# Patient Record
Sex: Female | Born: 1970 | Race: White | Hispanic: No | Marital: Married | Smoking: Former smoker
Health system: Southern US, Community
[De-identification: ages and names within clinical notes are randomized; demographics above are authoritative.]

## PROBLEM LIST (undated history)

## (undated) DIAGNOSIS — E119 Type 2 diabetes mellitus without complications: Secondary | ICD-10-CM

---

## 2001-10-02 ENCOUNTER — Other Ambulatory Visit: Admission: RE | Admit: 2001-10-02 | Discharge: 2001-10-02 | Payer: Self-pay | Admitting: Family Medicine

## 2006-02-23 ENCOUNTER — Emergency Department (HOSPITAL_COMMUNITY): Admission: EM | Admit: 2006-02-23 | Discharge: 2006-02-23 | Payer: Self-pay | Admitting: Emergency Medicine

## 2013-03-24 ENCOUNTER — Other Ambulatory Visit (HOSPITAL_COMMUNITY)
Admission: RE | Admit: 2013-03-24 | Discharge: 2013-03-24 | Disposition: A | Discharge status: Home / Self Care | Payer: BC Managed Care – PPO | Source: Ambulatory Visit | Attending: Family Medicine | Admitting: Family Medicine

## 2013-03-24 ENCOUNTER — Other Ambulatory Visit: Payer: Self-pay | Admitting: Family Medicine

## 2013-03-24 DIAGNOSIS — Z1151 Encounter for screening for human papillomavirus (HPV): Secondary | ICD-10-CM | POA: Insufficient documentation

## 2013-03-24 DIAGNOSIS — Z124 Encounter for screening for malignant neoplasm of cervix: Secondary | ICD-10-CM | POA: Insufficient documentation

## 2013-03-25 ENCOUNTER — Other Ambulatory Visit: Payer: Self-pay | Admitting: Family Medicine

## 2013-03-25 DIAGNOSIS — Z1231 Encounter for screening mammogram for malignant neoplasm of breast: Secondary | ICD-10-CM

## 2013-04-30 ENCOUNTER — Ambulatory Visit: Payer: Self-pay

## 2013-05-06 ENCOUNTER — Ambulatory Visit
Admission: RE | Admit: 2013-05-06 | Discharge: 2013-05-06 | Disposition: A | Discharge status: Home / Self Care | Payer: BC Managed Care – PPO | Source: Ambulatory Visit | Attending: Family Medicine | Admitting: Family Medicine

## 2013-05-06 DIAGNOSIS — Z1231 Encounter for screening mammogram for malignant neoplasm of breast: Secondary | ICD-10-CM

## 2014-02-22 ENCOUNTER — Encounter: Payer: Self-pay | Admitting: Endocrinology

## 2014-02-22 ENCOUNTER — Ambulatory Visit (INDEPENDENT_AMBULATORY_CARE_PROVIDER_SITE_OTHER): Payer: BC Managed Care – PPO | Admitting: Endocrinology

## 2014-02-22 VITALS — BP 122/82 | HR 109 | Temp 99.5°F | Ht 63.0 in | Wt 240.0 lb

## 2014-02-22 DIAGNOSIS — I10 Essential (primary) hypertension: Secondary | ICD-10-CM

## 2014-02-22 DIAGNOSIS — E785 Hyperlipidemia, unspecified: Secondary | ICD-10-CM

## 2014-02-22 DIAGNOSIS — E119 Type 2 diabetes mellitus without complications: Secondary | ICD-10-CM

## 2014-02-22 MED ORDER — INSULIN LISPRO 100 UNIT/ML (KWIKPEN): 10.0000 [IU] | PEN_INJECTOR | Freq: Three times a day (TID) | SUBCUTANEOUS | Status: DC

## 2014-02-22 NOTE — Progress Notes (Signed)
   Subjective:    Patient ID: Crystal Dudley, female    DOB: 11/14/1971, 43 y.o.   MRN: [id number]  HPI pt states DM was dx'ed in 2007; she has mild if any neuropathy of the lower extremities; she is unaware of any associated chronic complications.  She took lantus from 2010-2012, but has been off insulin since then.  pt says her diet and exercise are "fair."    No past medical history on file.  No past surgical history on file.  History   Social History  . Marital Status: Single    Spouse Name: N/A    Number of Children: N/A  . Years of Education: N/A   Occupational History  . Not on file.   Social History Main Topics  . Smoking status: Former Games developermoker  . Smokeless tobacco: Not on file  . Alcohol Use: Yes  . Drug Use: Not on file  . Sexual Activity: Not on file   Other Topics Concern  . Not on file   Social History Narrative  . No narrative on file    No current outpatient prescriptions on file prior to visit.   No current facility-administered medications on file prior to visit.    Allergies not on file  No family history on file. DM: father BP 122/82  Pulse 109  Temp(Src) 99.5 F (37.5 C) (Oral)  Ht 5\' 3"  (1.6 m)  Wt 240 lb (108.863 kg)  BMI 42.52 kg/m2  SpO2 95%  Review of Systems denies weight loss, blurry vision, headache, chest pain, sob, n/v, cramps, excessive diaphoresis, difficulty with concentration, depression, rhinorrhea, and easy bruising.  She has regular menses.  She attributes polyuria to diuretic rx.  She has lost weight, due to her efforts.     Objective:   Physical Exam VS: see vs page GEN: no distress HEAD: head: no deformity eyes: no periorbital swelling, no proptosis external nose and ears are normal mouth: no lesion seen NECK: supple, thyroid is not enlarged CHEST WALL: no deformity LUNGS:  Clear to auscultation CV: reg rate and rhythm, no murmur ABD: abdomen is soft, nontender.  no hepatosplenomegaly.  not distended.  no  hernia MUSCULOSKELETAL: muscle bulk and strength are grossly normal.  no obvious joint swelling.  gait is normal and steady PULSES:  no carotid bruit NEURO:  cn 2-12 grossly intact.   readily moves all 4's.   SKIN:  Normal texture and temperature.  No rash or suspicious lesion is visible.   NODES:  None palpable at the neck PSYCH: alert, well-oriented.  Does not appear anxious nor depressed.   outside test results are reviewed: A1c=10.5    Assessment & Plan:  Type 2 DM: poor control.  Oral meds have failed.  This a1c causes very high risk to her health.  We discussed types of insulin.   She chooses qac insulin for now. Weight loss: hyperglycemia may be contributing. Morbid obesity: surgery will help this. Polyuria: probably due to diuretic and and hyperglycemia

## 2014-02-22 NOTE — Patient Instructions (Addendum)
good diet and exercise habits significanly improve the control of your diabetes.  please let me know if you wish to be referred to a dietician.  high blood sugar is very risky to your health.  you should see an eye doctor every year.  You are at higher than average risk for pneumonia and hepatitis-B.  You should be vaccinated against both.   controlling your blood pressure and cholesterol drastically reduces the damage diabetes does to your body.  this also applies to quitting smoking.  please discuss these with your doctor.  check your blood sugar twice a day.  vary the time of day when you check, between before the 3 meals, and at bedtime.  also check if you have symptoms of your blood sugar being too high or too low.  please keep a record of the readings and bring it to your next appointment here.  You can write it on any piece of paper.  please call us sooner if your blood sugar goes below 70, or if you have a lot of readings over 200. Please consider having weight-loss surgery.  You can call [phone], to find out about the next informational meeting.   Please start taking humalog, 10 units 3 times a day (just before each meal).  i have sent a prescription to your pharmacy. Please come back for a follow-up appointment in 2 weeks.

## 2014-02-24 DIAGNOSIS — I10 Essential (primary) hypertension: Secondary | ICD-10-CM | POA: Insufficient documentation

## 2014-02-24 DIAGNOSIS — E785 Hyperlipidemia, unspecified: Secondary | ICD-10-CM | POA: Insufficient documentation

## 2014-02-24 DIAGNOSIS — E119 Type 2 diabetes mellitus without complications: Secondary | ICD-10-CM | POA: Insufficient documentation

## 2014-03-09 ENCOUNTER — Ambulatory Visit (INDEPENDENT_AMBULATORY_CARE_PROVIDER_SITE_OTHER): Payer: BC Managed Care – PPO | Admitting: Endocrinology

## 2014-03-09 ENCOUNTER — Encounter: Payer: Self-pay | Admitting: Endocrinology

## 2014-03-09 VITALS — BP 122/80 | HR 89 | Temp 97.9°F | Ht 63.0 in | Wt 242.0 lb

## 2014-03-09 DIAGNOSIS — E119 Type 2 diabetes mellitus without complications: Secondary | ICD-10-CM

## 2014-03-09 MED ORDER — INSULIN GLARGINE 100 UNIT/ML SOLOSTAR PEN: 50.0000 [IU] | PEN_INJECTOR | SUBCUTANEOUS | Status: DC

## 2014-03-09 NOTE — Patient Instructions (Addendum)
check your blood sugar twice a day.  vary the time of day when you check, between before the 3 meals, and at bedtime.  also check if you have symptoms of your blood sugar being too high or too low.  please keep a record of the readings and bring it to your next appointment here.  You can write it on any piece of paper.  please call us sooner if your blood sugar goes below 70, or if you have a lot of readings over 200. Please consider having weight-loss surgery.  You can call [phone], to find out about the next informational meeting.   Please change humalog to lantus, 50 units each morning.  i have sent a prescription to your pharmacy. You can stop taking the kombiglyze and invokana. Please come back for a follow-up appointment in 1 month.

## 2014-03-09 NOTE — Progress Notes (Signed)
   Subjective:    Patient ID: Crystal Dudley, female    DOB: 08/09/1971, 43 y.o.   MRN: [id number]  HPI pt returns for f/u of insulin-requiring DM (dx'ed 2007; she has mild if any neuropathy of the lower extremities; she is unaware of any associated chronic complications.  She took lantus from 2010-2012, but has been off insulin until it was restarted in March of 2015.  Pt says she can't remember to take the insulin tid.  She wants to change to QD insulin.  no cbg record, but states cbg's are in the high-100's.  pt states she feels well in general. No past medical history on file.  No past surgical history on file.  History   Social History  . Marital Status: Single    Spouse Name: N/A    Number of Children: N/A  . Years of Education: N/A   Occupational History  . Not on file.   Social History Main Topics  . Smoking status: Former Games developermoker  . Smokeless tobacco: Not on file  . Alcohol Use: Yes  . Drug Use: Not on file  . Sexual Activity: Not on file   Other Topics Concern  . Not on file   Social History Narrative  . No narrative on file    Current Outpatient Prescriptions on File Prior to Visit  Medication Sig Dispense Refill  . aspirin 81 MG tablet Take 81 mg by mouth daily.      Marland Kitchen. BYDUREON 2 MG SUSR       . hydrochlorothiazide (HYDRODIURIL) 25 MG tablet Take 25 mg by mouth daily.      Marland Kitchen. lisinopril (PRINIVIL,ZESTRIL) 40 MG tablet       . simvastatin (ZOCOR) 40 MG tablet        No current facility-administered medications on file prior to visit.    Allergies  Allergen Reactions  . Codeine Nausea And Vomiting    No family history on file.  BP 122/80  Pulse 89  Temp(Src) 97.9 F (36.6 C) (Oral)  Ht 5\' 3"  (1.6 m)  Wt 242 lb (109.77 kg)  BMI 42.88 kg/m2  SpO2 95%  Review of Systems She denies hypoglycemia and weight change.      Objective:   Physical Exam VITAL SIGNS:  See vs page GENERAL: no distress SKIN:  Insulin injection sites at the anterior abdomen  are normal.       Assessment & Plan:  Type 2 DM: she needs increased rx.  We discussed types of insulin.  She wants to change to QD insulin.   Morbid obesity: this complicates the rx of DM.

## 2014-04-11 ENCOUNTER — Ambulatory Visit (INDEPENDENT_AMBULATORY_CARE_PROVIDER_SITE_OTHER): Payer: BC Managed Care – PPO | Admitting: Endocrinology

## 2014-04-11 ENCOUNTER — Encounter: Payer: Self-pay | Admitting: Endocrinology

## 2014-04-11 VITALS — BP 128/86 | HR 91 | Temp 99.3°F | Ht 63.0 in | Wt 249.0 lb

## 2014-04-11 DIAGNOSIS — E119 Type 2 diabetes mellitus without complications: Secondary | ICD-10-CM

## 2014-04-11 NOTE — Patient Instructions (Addendum)
check your blood sugar twice a day.  vary the time of day when you check, between before the 3 meals, and at bedtime.  also check if you have symptoms of your blood sugar being too high or too low.  please keep a record of the readings and bring it to your next appointment here.  You can write it on any piece of paper.  please call us sooner if your blood sugar goes below 70, or if you have a lot of readings over 200.   Our goal is to get the blood sugar to the low-100's.  If it is higher, please try to re-increase the lantus, to see if the nausea happens again.   Please come back for a follow-up appointment in 2 months.

## 2014-04-11 NOTE — Progress Notes (Signed)
   Subjective:    Patient ID: Crystal Dudley, female    DOB: 10/18/1971, 43 y.o.   MRN: [id number]  HPI pt returns for f/u of insulin-requiring DM (dx'ed 2007; she has mild if any neuropathy of the lower extremities; she is unaware of any associated chronic complications.  She took lantus from 2010-2012, but has been off insulin until it was restarted in March of 2015; she takes qd insulin, because she can't remember to take it tid; she declined weight-loss surgery).  She reduced the lantus to 40 units qd, due to nausea.  On the reduced amount, the nausea is resolved.  She does not check cbg's.  She says she has a meter and strips, but does not check.   No past medical history on file.  No past surgical history on file.  History   Social History  . Marital Status: Single    Spouse Name: N/A    Number of Children: N/A  . Years of Education: N/A   Occupational History  . Not on file.   Social History Main Topics  . Smoking status: Former Games developermoker  . Smokeless tobacco: Not on file  . Alcohol Use: Yes  . Drug Use: Not on file  . Sexual Activity: Not on file   Other Topics Concern  . Not on file   Social History Narrative  . No narrative on file    Current Outpatient Prescriptions on File Prior to Visit  Medication Sig Dispense Refill  . aspirin 81 MG tablet Take 81 mg by mouth daily.      Marland Kitchen. BYDUREON 2 MG SUSR       . hydrochlorothiazide (HYDRODIURIL) 25 MG tablet Take 25 mg by mouth daily.      Marland Kitchen. lisinopril (PRINIVIL,ZESTRIL) 40 MG tablet       . simvastatin (ZOCOR) 40 MG tablet        No current facility-administered medications on file prior to visit.    Allergies  Allergen Reactions  . Codeine Nausea And Vomiting    No family history on file.  BP 128/86  Pulse 91  Temp(Src) 99.3 F (37.4 C) (Oral)  Ht 5\' 3"  (1.6 m)  Wt 249 lb (112.946 kg)  BMI 44.12 kg/m2  SpO2 97%  Review of Systems She denies hypoglycemia and weight change.      Objective:   Physical  Exam VITAL SIGNS:  See vs page GENERAL: no distress     Assessment & Plan:  Nausea: more likely due to bydureon, rather than insulin, but pt says she wants to continue the bydureon.  Noncompliance with cbg recording: we discussed the importance of compliance.

## 2014-06-21 ENCOUNTER — Ambulatory Visit: Payer: BC Managed Care – PPO | Admitting: Endocrinology

## 2014-07-05 ENCOUNTER — Ambulatory Visit (INDEPENDENT_AMBULATORY_CARE_PROVIDER_SITE_OTHER): Payer: BC Managed Care – PPO | Admitting: Endocrinology

## 2014-07-05 ENCOUNTER — Encounter: Payer: Self-pay | Admitting: Endocrinology

## 2014-07-05 VITALS — BP 124/76 | HR 94 | Temp 99.1°F | Ht 63.0 in | Wt 241.0 lb

## 2014-07-05 DIAGNOSIS — E119 Type 2 diabetes mellitus without complications: Secondary | ICD-10-CM

## 2014-07-05 LAB — BASIC METABOLIC PANEL
BUN: 13 mg/dL (ref 6–23)
CO2: 24 meq/L (ref 19–32)
Calcium: 9.2 mg/dL (ref 8.4–10.5)
Chloride: 99 mEq/L (ref 96–112)
Creatinine, Ser: 0.6 mg/dL (ref 0.4–1.2)
GFR: 110.4 mL/min (ref >60.00)
Glucose, Bld: 317 mg/dL — ABNORMAL HIGH (ref 70–99)
POTASSIUM: 4.6 meq/L (ref 3.5–5.1)
SODIUM: 133 meq/L — AB (ref 135–145)

## 2014-07-05 LAB — MICROALBUMIN / CREATININE URINE RATIO
CREATININE, U: 31.5 mg/dL
MICROALB UR: 2.7 mg/dL — AB (ref 0.0–1.9)
MICROALB/CREAT RATIO: 8.6 mg/g (ref 0.0–30.0)

## 2014-07-05 LAB — HEMOGLOBIN A1C: HEMOGLOBIN A1C: 12.8 % — AB (ref 4.6–6.5)

## 2014-07-05 NOTE — Progress Notes (Signed)
Subjective:    Patient ID: Crystal Dudley, female    DOB: 07/19/1971, 43 y.o.   MRN: [id number]  HPI pt returns for f/u of insulin-requiring DM (dx'ed 2007; she has mild if any neuropathy of the lower extremities; she is unaware of any associated chronic complications.  He has never had GDM, pancreatitis, severe hypoglycemia, or DKA; she took lantus from 2010-2012, but has been off insulin until it was restarted in March of 2015; she was rx'ed qd insulin, because she can't remember to take it tid; she declined weight-loss surgery).  Pt says she is having trouble remembering to take the insulin.  She says she has not taken it in the past month.  She seldom checks cbg's.  Nausea is resolved.  She has urinary frequency.  She says it would be easier for her to remember diabetes pills.  No past medical history on file.  No past surgical history on file.  History   Social History  . Marital Status: Single    Spouse Name: N/A    Number of Children: N/A  . Years of Education: N/A   Occupational History  . Not on file.   Social History Main Topics  . Smoking status: Former Games developermoker  . Smokeless tobacco: Not on file  . Alcohol Use: Yes  . Drug Use: Not on file  . Sexual Activity: Not on file   Other Topics Concern  . Not on file   Social History Narrative  . No narrative on file    Current Outpatient Prescriptions on File Prior to Visit  Medication Sig Dispense Refill  . aspirin 81 MG tablet Take 81 mg by mouth daily.      . cetirizine (ZYRTEC) 10 MG tablet Take 10 mg by mouth daily.      . hydrochlorothiazide (HYDRODIURIL) 25 MG tablet Take 25 mg by mouth daily.      Marland Kitchen. lisinopril (PRINIVIL,ZESTRIL) 40 MG tablet       . simvastatin (ZOCOR) 40 MG tablet        No current facility-administered medications on file prior to visit.    Allergies  Allergen Reactions  . Codeine Nausea And Vomiting    No family history on file.  BP 124/76  Pulse 94  Temp(Src) 99.1 F (37.3 C)  (Oral)  Ht 5\' 3"  (1.6 m)  Wt 241 lb (109.317 kg)  BMI 42.70 kg/m2  SpO2 99%  Review of Systems Denies depression.  She says she does not have memory loss in general.      Objective:   Physical Exam Pulses: dorsalis pedis intact bilat.   Feet: no deformity. normal color and temp.  no edema.   Skin:  no ulcer on the feet.   Neuro: sensation is intact to touch on the feet.  Lab Results  Component Value Date   HGBA1C 12.8* 07/05/2014      Assessment & Plan:  Noncompliance with cbg recording and insulin dosing, severe exacerbation: I'll work around this as best I can. Nausea, uncertain etiology, resolved.   Patient is advised the following: Patient Instructions  check your blood sugar twice a day.  vary the time of day when you check, between before the 3 meals, and at bedtime.  also check if you have symptoms of your blood sugar being too high or too low.  please keep a record of the readings and bring it to your next appointment here.  You can write it on any piece of paper.  please  call us sooner if your blood sugar goes below 70, or if you have a lot of readings over 200.   blood tests are being requested for you today.  We'll contact you with results. Based on the results, we may be able to get your blood sugar with pills.   Please come back for a follow-up appointment in 1 month.    addendum: pt is advised a1c is too high for oral meds.

## 2014-07-05 NOTE — Patient Instructions (Addendum)
check your blood sugar twice a day.  vary the time of day when you check, between before the 3 meals, and at bedtime.  also check if you have symptoms of your blood sugar being too high or too low.  please keep a record of the readings and bring it to your next appointment here.  You can write it on any piece of paper.  please call us sooner if your blood sugar goes below 70, or if you have a lot of readings over 200.   blood tests are being requested for you today.  We'll contact you with results. Based on the results, we may be able to get your blood sugar with pills.   Please come back for a follow-up appointment in 1 month.

## 2014-08-05 ENCOUNTER — Ambulatory Visit: Payer: BC Managed Care – PPO | Admitting: Endocrinology

## 2014-08-08 ENCOUNTER — Ambulatory Visit: Payer: BC Managed Care – PPO | Admitting: Endocrinology

## 2014-11-10 ENCOUNTER — Ambulatory Visit: Payer: BC Managed Care – PPO | Admitting: Endocrinology

## 2014-11-23 ENCOUNTER — Ambulatory Visit (INDEPENDENT_AMBULATORY_CARE_PROVIDER_SITE_OTHER): Payer: BC Managed Care – PPO | Admitting: Endocrinology

## 2014-11-23 ENCOUNTER — Encounter: Payer: Self-pay | Admitting: Endocrinology

## 2014-11-23 VITALS — BP 132/84 | HR 88 | Temp 99.0°F | Ht 63.0 in | Wt 251.0 lb

## 2014-11-23 DIAGNOSIS — E119 Type 2 diabetes mellitus without complications: Secondary | ICD-10-CM

## 2014-11-23 MED ORDER — INSULIN GLARGINE 100 UNIT/ML SOLOSTAR PEN: 70.0000 [IU] | PEN_INJECTOR | SUBCUTANEOUS | Status: DC

## 2014-11-23 NOTE — Progress Notes (Signed)
Subjective:    Patient ID: Crystal Dudley, female    DOB: 08/18/1971, 43 y.o.   MRN: [id number]  HPI  Pt returns for f/u of diabetes mellitus: DM type: Insulin-requiring type 2 Dx'ed: 2007 Complications: none Therapy: insulin 2010-2012, and for a few mos in early 2015 GDM: never DKA: never Severe hypoglycemia: never Pancreatitis: never Other: she was rx'ed qd insulin, because she can't remember to take it tid; she declined weight-loss surgery Interval history: She has polyuria and polydipsia.  She takes 40-50 units of lantus qd.  She says she seldom misses it altogether.  She does not check cbg's, but she says she has a meter and strips.   No past medical history on file.  No past surgical history on file.  History   Social History  . Marital Status: Single    Spouse Name: N/A    Number of Children: N/A  . Years of Education: N/A   Occupational History  . Not on file.   Social History Main Topics  . Smoking status: Former Games developermoker  . Smokeless tobacco: Not on file  . Alcohol Use: Yes  . Drug Use: Not on file  . Sexual Activity: Not on file   Other Topics Concern  . Not on file   Social History Narrative    Current Outpatient Prescriptions on File Prior to Visit  Medication Sig Dispense Refill  . aspirin 81 MG tablet Take 81 mg by mouth daily.    . Canagliflozin (INVOKANA) 300 MG TABS Take 1 tablet by mouth daily.    . cetirizine (ZYRTEC) 10 MG tablet Take 10 mg by mouth daily.    . hydrochlorothiazide (HYDRODIURIL) 25 MG tablet Take 25 mg by mouth daily.    Marland Kitchen. lisinopril (PRINIVIL,ZESTRIL) 40 MG tablet     . simvastatin (ZOCOR) 40 MG tablet      No current facility-administered medications on file prior to visit.    Allergies  Allergen Reactions  . Codeine Nausea And Vomiting    No family history on file.  BP 132/84 mmHg  Pulse 88  Temp(Src) 99 F (37.2 C) (Oral)  Ht 5\' 3"  (1.6 m)  Wt 251 lb (113.853 kg)  BMI 44.47 kg/m2  SpO2 94%  Review of  Systems She has weight gain, but denies n/v.    Objective:   Physical Exam VITAL SIGNS:  See vs page. GENERAL: no distress. Pulses: dorsalis pedis intact bilat.   Feet: no deformity.  1+ bilat leg edema. Skin:  no ulcer on the feet.  normal color and temp. Neuro: sensation is intact to touch on the feet.    outside test results are reviewed: A1c=12.2  TRIG=[id number]    Assessment & Plan:  DM: severe exacerbation Noncompliance, apparently improved: I'll work around this as best I can.   Weight gain: pt is advised to re-lose   Patient is advised the following: Patient Instructions  Please increase the lantus to 70 units each morning.  i have sent a prescription to your pharmacy check your blood sugar twice a day.  vary the time of day when you check, between before the 3 meals, and at bedtime.  also check if you have symptoms of your blood sugar being too high or too low.  please keep a record of the readings and bring it to your next appointment here.  You can write it on any piece of paper.  please call us sooner if your blood sugar goes below 70, or if you  have a lot of readings over 200.   Please come back for a follow-up appointment in 6 weeks.

## 2014-11-23 NOTE — Patient Instructions (Signed)
Please increase the lantus to 70 units each morning.  i have sent a prescription to your pharmacy check your blood sugar twice a day.  vary the time of day when you check, between before the 3 meals, and at bedtime.  also check if you have symptoms of your blood sugar being too high or too low.  please keep a record of the readings and bring it to your next appointment here.  You can write it on any piece of paper.  please call us sooner if your blood sugar goes below 70, or if you have a lot of readings over 200.   Please come back for a follow-up appointment in 6 weeks.

## 2014-12-03 ENCOUNTER — Emergency Department (HOSPITAL_COMMUNITY): Payer: BC Managed Care – PPO

## 2014-12-03 ENCOUNTER — Encounter (HOSPITAL_COMMUNITY): Payer: Self-pay | Admitting: Emergency Medicine

## 2014-12-03 ENCOUNTER — Emergency Department (HOSPITAL_COMMUNITY)
Admission: EM | Admit: 2014-12-03 | Discharge: 2014-12-04 | Disposition: A | Discharge status: Home / Self Care | Payer: BC Managed Care – PPO | Attending: Emergency Medicine | Admitting: Emergency Medicine

## 2014-12-03 DIAGNOSIS — Z7982 Long term (current) use of aspirin: Secondary | ICD-10-CM | POA: Diagnosis not present

## 2014-12-03 DIAGNOSIS — R1031 Right lower quadrant pain: Secondary | ICD-10-CM | POA: Diagnosis not present

## 2014-12-03 DIAGNOSIS — Z794 Long term (current) use of insulin: Secondary | ICD-10-CM | POA: Insufficient documentation

## 2014-12-03 DIAGNOSIS — Z3202 Encounter for pregnancy test, result negative: Secondary | ICD-10-CM | POA: Insufficient documentation

## 2014-12-03 DIAGNOSIS — Z87891 Personal history of nicotine dependence: Secondary | ICD-10-CM | POA: Diagnosis not present

## 2014-12-03 DIAGNOSIS — N12 Tubulo-interstitial nephritis, not specified as acute or chronic: Secondary | ICD-10-CM | POA: Insufficient documentation

## 2014-12-03 DIAGNOSIS — E119 Type 2 diabetes mellitus without complications: Secondary | ICD-10-CM | POA: Insufficient documentation

## 2014-12-03 DIAGNOSIS — R112 Nausea with vomiting, unspecified: Secondary | ICD-10-CM

## 2014-12-03 DIAGNOSIS — Z79899 Other long term (current) drug therapy: Secondary | ICD-10-CM | POA: Insufficient documentation

## 2014-12-03 HISTORY — DX: Type 2 diabetes mellitus without complications: E11.9

## 2014-12-03 LAB — CBC WITH DIFFERENTIAL/PLATELET
Basophils Absolute: 0 10*3/uL (ref 0.0–0.1)
Basophils Relative: 0 % (ref 0–1)
Eosinophils Absolute: 0.1 10*3/uL (ref 0.0–0.7)
Eosinophils Relative: 1 % (ref 0–5)
HCT: 40.3 % (ref 36.0–46.0)
HEMOGLOBIN: 12.9 g/dL (ref 12.0–15.0)
LYMPHS ABS: 2.5 10*3/uL (ref 0.7–4.0)
Lymphocytes Relative: 23 % (ref 12–46)
MCH: 28.8 pg (ref 26.0–34.0)
MCHC: 32 g/dL (ref 30.0–36.0)
MCV: 90 fL (ref 78.0–100.0)
MONOS PCT: 5 % (ref 3–12)
Monocytes Absolute: 0.5 10*3/uL (ref 0.1–1.0)
NEUTROS ABS: 7.6 10*3/uL (ref 1.7–7.7)
NEUTROS PCT: 71 % (ref 43–77)
PLATELETS: 336 10*3/uL (ref 150–400)
RBC: 4.48 MIL/uL (ref 3.87–5.11)
RDW: 13 % (ref 11.5–15.5)
WBC: 10.7 10*3/uL — ABNORMAL HIGH (ref 4.0–10.5)

## 2014-12-03 LAB — POC URINE PREG, ED: PREG TEST UR: NEGATIVE

## 2014-12-03 LAB — COMPREHENSIVE METABOLIC PANEL
ALBUMIN: 3.7 g/dL (ref 3.5–5.2)
ALK PHOS: 95 U/L (ref 39–117)
ALT: 15 U/L (ref 0–35)
AST: 15 U/L (ref 0–37)
Anion gap: 18 — ABNORMAL HIGH (ref 5–15)
BUN: 20 mg/dL (ref 6–23)
CO2: 21 mEq/L (ref 19–32)
Calcium: 9.7 mg/dL (ref 8.4–10.5)
Chloride: 92 mEq/L — ABNORMAL LOW (ref 96–112)
Creatinine, Ser: 0.63 mg/dL (ref 0.50–1.10)
GFR calc non Af Amer: >90 mL/min (ref >90)
GLUCOSE: 373 mg/dL — AB (ref 70–99)
POTASSIUM: 4.9 meq/L (ref 3.7–5.3)
Sodium: 131 mEq/L — ABNORMAL LOW (ref 137–147)
Total Bilirubin: <0.2 mg/dL — ABNORMAL LOW (ref 0.3–1.2)
Total Protein: 7.5 g/dL (ref 6.0–8.3)

## 2014-12-03 LAB — URINALYSIS, ROUTINE W REFLEX MICROSCOPIC
BILIRUBIN URINE: NEGATIVE
Ketones, ur: 40 mg/dL — AB
Nitrite: POSITIVE — AB
Protein, ur: NEGATIVE mg/dL
SPECIFIC GRAVITY, URINE: 1.03 (ref 1.005–1.030)
UROBILINOGEN UA: 0.2 mg/dL (ref 0.0–1.0)
pH: 5 (ref 5.0–8.0)

## 2014-12-03 LAB — LIPASE, BLOOD: Lipase: 61 U/L — ABNORMAL HIGH (ref 11–59)

## 2014-12-03 LAB — URINE MICROSCOPIC-ADD ON

## 2014-12-03 MED ORDER — MORPHINE SULFATE 4 MG/ML IJ SOLN
4.0000 mg | Freq: Once | INTRAMUSCULAR | Status: AC
  Administered 2014-12-03: 4 mg via INTRAVENOUS
  Filled 2014-12-03: qty 1

## 2014-12-03 MED ORDER — MORPHINE SULFATE 4 MG/ML IJ SOLN
2.0000 mg | Freq: Once | INTRAMUSCULAR | Status: DC
  Filled 2014-12-03: qty 1

## 2014-12-03 MED ORDER — ONDANSETRON HCL 4 MG PO TABS: 4.0000 mg | ORAL_TABLET | Freq: Four times a day (QID) | ORAL | Status: DC | PRN

## 2014-12-03 MED ORDER — MORPHINE SULFATE 4 MG/ML IJ SOLN
4.0000 mg | Freq: Once | INTRAMUSCULAR | Status: AC | PRN
  Administered 2014-12-03: 4 mg via INTRAVENOUS
  Filled 2014-12-03: qty 1

## 2014-12-03 MED ORDER — IOHEXOL 300 MG/ML  SOLN
100.0000 mL | Freq: Once | INTRAMUSCULAR | Status: AC | PRN
  Administered 2014-12-03: 100 mL via INTRAVENOUS

## 2014-12-03 MED ORDER — SODIUM CHLORIDE 0.9 % IV BOLUS (SEPSIS)
1000.0000 mL | Freq: Once | INTRAVENOUS | Status: AC
  Administered 2014-12-03: 1000 mL via INTRAVENOUS

## 2014-12-03 MED ORDER — ONDANSETRON HCL 4 MG/2ML IJ SOLN
4.0000 mg | INTRAMUSCULAR | Status: AC
  Administered 2014-12-03: 4 mg via INTRAVENOUS
  Filled 2014-12-03: qty 2

## 2014-12-03 MED ORDER — OXYCODONE-ACETAMINOPHEN 5-325 MG PO TABS: 1.0000 | ORAL_TABLET | Freq: Four times a day (QID) | ORAL | Status: DC | PRN

## 2014-12-03 MED ORDER — KETOROLAC TROMETHAMINE 30 MG/ML IJ SOLN
30.0000 mg | Freq: Once | INTRAMUSCULAR | Status: AC
  Administered 2014-12-03: 30 mg via INTRAVENOUS
  Filled 2014-12-03: qty 1

## 2014-12-03 MED ORDER — CEPHALEXIN 500 MG PO CAPS: 500.0000 mg | ORAL_CAPSULE | Freq: Four times a day (QID) | ORAL | Status: DC

## 2014-12-03 MED ORDER — DEXTROSE 5 % IV SOLN
1.0000 g | Freq: Once | INTRAVENOUS | Status: AC
  Administered 2014-12-03: 1 g via INTRAVENOUS
  Filled 2014-12-03: qty 10

## 2014-12-03 MED ORDER — OXYCODONE-ACETAMINOPHEN 5-325 MG PO TABS
2.0000 | ORAL_TABLET | Freq: Once | ORAL | Status: AC
  Administered 2014-12-03: 2 via ORAL
  Filled 2014-12-03: qty 2

## 2014-12-03 NOTE — ED Provider Notes (Signed)
CSN: [id number]     Arrival date & time 12/03/14  1538 History   First MD Initiated Contact with Patient 12/03/14 2005     Chief Complaint  Patient presents with  . Nausea  . Emesis  . Pain    (Consider location/radiation/quality/duration/timing/severity/associated sxs/prior Treatment) HPI Comments: Patient is a 43 year old female with a history of diabetes mellitus who presents to the emergency department for further evaluation of pain to her right side. Patient states that pain began yesterday and has been constantly worsening since onset. She states that she had associated nausea, but this became associated with emesis 2 hours prior to arrival. She has had approximately 5 episodes of vomiting since arriving in the ED. All emesis has been nonbloody. Patient denies any modifying factors of her symptoms and states that the pain radiates from her right lower abdomen around to her back. She denies any associated fever, chest pain, shortness of breath, melena, hematochezia, diarrhea, vaginal bleeding or discharge, hematuria, or burning with urination. She has noted some mild "pressure" in her suprapubic region with urination. No hx of abdominal surgeries.  Patient is a 43 y.o. female presenting with vomiting. The history is provided by the patient. No language interpreter was used.  Emesis Associated symptoms: abdominal pain     Past Medical History  Diagnosis Date  . Diabetes mellitus without complication    History reviewed. No pertinent past surgical history. No family history on file. History  Substance Use Topics  . Smoking status: Former Games developermoker  . Smokeless tobacco: Not on file  . Alcohol Use: Yes   OB History    No data available      Review of Systems  Gastrointestinal: Positive for nausea, vomiting and abdominal pain.  Genitourinary:       "pressure" when voiding; denies burning or hematuria  All other systems reviewed and are negative.   Allergies  Codeine  Home  Medications   Prior to Admission medications   Medication Sig Start Date End Date Taking? Authorizing Provider  aspirin 81 MG tablet Take 81 mg by mouth daily.   Yes Historical Provider, MD  Canagliflozin (INVOKANA) 300 MG TABS Take 1 tablet by mouth daily.   Yes Historical Provider, MD  cetirizine (ZYRTEC) 10 MG tablet Take 10 mg by mouth daily.   Yes Historical Provider, MD  hydrochlorothiazide (HYDRODIURIL) 25 MG tablet Take 25 mg by mouth daily.   Yes Historical Provider, MD  Insulin Glargine (LANTUS SOLOSTAR) 100 UNIT/ML Solostar Pen Inject 70 Units into the skin every morning. And pen needles 2/day 11/23/14  Yes Romero BellingSean Ellison, MD  lisinopril (PRINIVIL,ZESTRIL) 40 MG tablet  11/22/13  Yes Historical Provider, MD  simvastatin (ZOCOR) 40 MG tablet  11/22/13  Yes Historical Provider, MD   BP 111/55 mmHg  Pulse 94  Temp(Src) 99.3 F (37.4 C) (Oral)  Resp 17  Ht 5\' 3"  (1.6 m)  Wt 220 lb (99.791 kg)  BMI 38.98 kg/m2  SpO2 95%  LMP 11/17/2014   Physical Exam  Constitutional: She is oriented to person, place, and time. She appears well-developed and well-nourished. No distress.  Patient appears uncomfortable. She does not appear toxic/septic.  HENT:  Head: Normocephalic and atraumatic.  Eyes: Conjunctivae and EOM are normal. No scleral icterus.  Neck: Normal range of motion.  Cardiovascular: Regular rhythm and normal heart sounds.   Mild tachycardia  Pulmonary/Chest: Effort normal and breath sounds normal. No respiratory distress. She has no wheezes. She has no rales.  Respirations even and unlabored.  Lungs clear.  Abdominal:  Diffuse TTP of obese abdomen with more focal TTP noted in the RLQ at McBurney's point. No masses or peritoneal signs. No involuntary guarding. Abdomen soft.  Musculoskeletal: Normal range of motion.  Neurological: She is alert and oriented to person, place, and time. She exhibits normal muscle tone. Coordination normal.  Skin: Skin is warm and dry. No rash noted.  She is not diaphoretic. No erythema. No pallor.  Psychiatric: She has a normal mood and affect. Her behavior is normal.  Nursing note and vitals reviewed.   ED Course  Procedures (including critical care time) Labs Review Labs Reviewed  CBC WITH DIFFERENTIAL - Abnormal; Notable for the following:    WBC 10.7 (*)    All other components within normal limits  COMPREHENSIVE METABOLIC PANEL - Abnormal; Notable for the following:    Sodium 131 (*)    Chloride 92 (*)    Glucose, Bld 373 (*)    Total Bilirubin <0.2 (*)    Anion gap 18 (*)    All other components within normal limits  LIPASE, BLOOD - Abnormal; Notable for the following:    Lipase 61 (*)    All other components within normal limits  URINALYSIS, ROUTINE W REFLEX MICROSCOPIC - Abnormal; Notable for the following:    APPearance CLOUDY (*)    Glucose, UA >1000 (*)    Hgb urine dipstick LARGE (*)    Ketones, ur 40 (*)    Nitrite POSITIVE (*)    Leukocytes, UA MODERATE (*)    All other components within normal limits  URINE MICROSCOPIC-ADD ON - Abnormal; Notable for the following:    Bacteria, UA MANY (*)    All other components within normal limits  I-STAT CHEM 8, ED - Abnormal; Notable for the following:    Sodium 133 (*)    Glucose, Bld 267 (*)    Calcium, Ion 1.05 (*)    All other components within normal limits  URINE CULTURE  POC URINE PREG, ED    Imaging Review Ct Abdomen Pelvis W Contrast  12/03/2014   CLINICAL DATA:  RIGHT lower quadrant pain radiating to RIGHT flank, nausea and vomiting for 2 hours. History of diabetes.  EXAM: CT ABDOMEN AND PELVIS WITH CONTRAST  TECHNIQUE: Multidetector CT imaging of the abdomen and pelvis was performed using the standard protocol following bolus administration of intravenous contrast.  CONTRAST:  OMNIPAQUE IOHEXOL 300 MG/ML  SOLN  COMPARISON:  None.  FINDINGS: LUNG BASES: Included view of the lung bases are clear. Visualized heart and pericardium are unremarkable.   SOLID ORGANS: Calcified granulomas in the spleen. Liver is diffusely mildly hypodense most consistent with fatty infiltration and enlarged, 24 cm in craniocaudad dimension. Adrenal glands, pancreas and gallbladder are unremarkable.  GASTROINTESTINAL TRACT: The stomach, small and large bowel are normal in course and caliber without inflammatory changes. Enteric contrast has not yet reached the distal small bowel. Normal appendix.  KIDNEYS/ URINARY TRACT: Kidneys are orthotopic, demonstrating symmetric enhancement. No nephrolithiasis, hydronephrosis or solid renal masses. Mildly dilated RIGHT ureter with uroepithelial enhancement. Urinary bladder is partially distended and unremarkable.  PERITONEUM/RETROPERITONEUM: No intraperitoneal free fluid nor free air. Aortoiliac vessels are normal in course and caliber, mild calcific atherosclerosis. No lymphadenopathy by CT size criteria. Internal reproductive organs are normal, 2.3 cm RIGHT adnexal cyst.  SOFT TISSUE/OSSEOUS STRUCTURES: Nonsuspicious.  IMPRESSION: Prominent RIGHT ureter with uroepithelial enhancement concerning for urinary tract infection without CT findings of pyelonephritis or urolithiasis.  Normal appendix.  Hepatomegaly and hepatic steatosis.   Electronically Signed   By: Awilda Metroourtnay  Bloomer   On: 12/03/2014 23:14     EKG Interpretation None      MDM   Final diagnoses:  Right lower quadrant abdominal pain  Nausea and vomiting  Pyelonephritis    43 year old female presents to the emergency department for pain to her right side radiating from her right mid and lower abdomen to her right flank. Symptoms have been persistent 2 days with associated nausea and vomiting. Patient has a mild leukocytosis of 10.7 today. Urinalysis is also suggestive of infection. CT obtained prior to completion of UA to r/o appendicitis. This was significant for prominent R ureter with findings concerning for UTI. Given radiation of pain to flank, symptoms c/w early  pyelonephritis. Patient given IV Rocephin in ED. Pain controlled with morphine and Toradol. No further emesis since receiving Zofran. Patient tolerating PO fluids. She will be discharged with Rx for Keflex as well as Percocet for pain control. Patient agreeable to plan with no unaddressed concerns. Return precautions provided and patient discharged in good condition; VSS.   Filed Vitals:   12/03/14 2130 12/03/14 2230 12/03/14 2300 12/03/14 2346  BP: 123/40 115/60 111/55 102/55  Pulse: 85 84 94 93  Temp:      TempSrc:      Resp:      Height:      Weight:      SpO2: 99% 97% 95% 97%      Antony MaduraKelly Ceylin Dreibelbis, PA-C 12/04/14 0105  Nelia Shiobert L Beaton, MD 12/04/14 [phone]

## 2014-12-03 NOTE — ED Notes (Signed)
Pt c/o N/V and right sided body pain onset 2 hours PTA. Pt denies exposure to others with illness or change in diet.

## 2014-12-03 NOTE — Discharge Instructions (Signed)
Pyelonephritis, Adult °Pyelonephritis is a kidney infection. In general, there are 2 main types of pyelonephritis: °· Infections that come on quickly without any warning (acute pyelonephritis). °· Infections that persist for a long period of time (chronic pyelonephritis). °CAUSES  °Two main causes of pyelonephritis are: °· Bacteria traveling from the bladder to the kidney. This is a problem especially in pregnant women. The urine in the bladder can become filled with bacteria from multiple causes, including: °¨ Inflammation of the prostate gland (prostatitis). °¨ Sexual intercourse in females. °¨ Bladder infection (cystitis). °· Bacteria traveling from the bloodstream to the tissue part of the kidney. °Problems that may increase your risk of getting a kidney infection include: °· Diabetes. °· Kidney stones or bladder stones. °· Cancer. °· Catheters placed in the bladder. °· Other abnormalities of the kidney or ureter. °SYMPTOMS  °· Abdominal pain. °· Pain in the side or flank area. °· Fever. °· Chills. °· Upset stomach. °· Blood in the urine (dark urine). °· Frequent urination. °· Strong or persistent urge to urinate. °· Burning or stinging when urinating. °DIAGNOSIS  °Your caregiver may diagnose your kidney infection based on your symptoms. A urine sample may also be taken. °TREATMENT  °In general, treatment depends on how severe the infection is.  °· If the infection is mild and caught early, your caregiver may treat you with oral antibiotics and send you home. °· If the infection is more severe, the bacteria may have gotten into the bloodstream. This will require intravenous (IV) antibiotics and a hospital stay. Symptoms may include: °¨ High fever. °¨ Severe flank pain. °¨ Shaking chills. °· Even after a hospital stay, your caregiver may require you to be on oral antibiotics for a period of time. °· Other treatments may be required depending upon the cause of the infection. °HOME CARE INSTRUCTIONS  °· Take your  antibiotics as directed. Finish them even if you start to feel better. °· Make an appointment to have your urine checked to make sure the infection is gone. °· Drink enough fluids to keep your urine clear or pale yellow. °· Take medicines for the bladder if you have urgency and frequency of urination as directed by your caregiver. °SEEK IMMEDIATE MEDICAL CARE IF:  °· You have a fever or persistent symptoms for more than 2-3 days. °· You have a fever and your symptoms suddenly get worse. °· You are unable to take your antibiotics or fluids. °· You develop shaking chills. °· You experience extreme weakness or fainting. °· There is no improvement after 2 days of treatment. °MAKE SURE YOU: °· Understand these instructions. °· Will watch your condition. °· Will get help right away if you are not doing well or get worse. °Document Released: 12/09/2005 Document Revised: 06/09/2012 Document Reviewed: 05/15/2011 °ExitCare® Patient Information ©2015 [employer]. This information is not intended to replace advice given to you by your health care provider. Make sure you discuss any questions you have with your health care provider. ° °

## 2014-12-04 LAB — I-STAT CHEM 8, ED
BUN: 17 mg/dL (ref 6–23)
CHLORIDE: 102 meq/L (ref 96–112)
CREATININE: 0.6 mg/dL (ref 0.50–1.10)
Calcium, Ion: 1.05 mmol/L — ABNORMAL LOW (ref 1.12–1.23)
Glucose, Bld: 267 mg/dL — ABNORMAL HIGH (ref 70–99)
HCT: 37 % (ref 36.0–46.0)
Hemoglobin: 12.6 g/dL (ref 12.0–15.0)
POTASSIUM: 4.6 meq/L (ref 3.7–5.3)
SODIUM: 133 meq/L — AB (ref 137–147)
TCO2: 18 mmol/L (ref 0–100)

## 2014-12-06 LAB — URINE CULTURE: Colony Count: >=100000

## 2014-12-07 ENCOUNTER — Telehealth (HOSPITAL_COMMUNITY): Payer: Self-pay

## 2014-12-07 NOTE — ED Notes (Signed)
Post ED Visit - Positive Culture Follow-up  Culture report reviewed by antimicrobial stewardship pharmacist: [x]  Wes Dulaney, Pharm.D., BCPS []  Celedonio MiyamotoJeremy Frens, Pharm.D., BCPS []  Georgina PillionElizabeth Martin, Pharm.D., BCPS []  FreedomMinh Pham, [address]Ph[address]D., BCPS, AAHIVP []  Estella HuskMichelle Turner, Pharm.D., BCPS, AAHIVP []  Elder CyphersLorie Poole, [address]Pharm[address]D., BCPS  Positive urine culture Treated with cephalexin, organism sensitive to the same and no further patient follow-up is required at this time.  Ashley JacobsFesterman, Xzaiver Vayda C 12/07/2014, 10:30 AM

## 2014-12-12 ENCOUNTER — Encounter: Payer: Self-pay | Admitting: Endocrinology

## 2015-01-04 ENCOUNTER — Ambulatory Visit: Payer: BC Managed Care – PPO | Admitting: Endocrinology

## 2015-05-23 ENCOUNTER — Other Ambulatory Visit: Payer: Self-pay | Admitting: Family Medicine

## 2015-05-23 DIAGNOSIS — Z1231 Encounter for screening mammogram for malignant neoplasm of breast: Secondary | ICD-10-CM

## 2015-05-30 ENCOUNTER — Ambulatory Visit: Payer: Self-pay

## 2015-06-06 ENCOUNTER — Ambulatory Visit
Admission: RE | Admit: 2015-06-06 | Discharge: 2015-06-06 | Disposition: A | Discharge status: Home / Self Care | Payer: BLUE CROSS/BLUE SHIELD | Source: Ambulatory Visit | Attending: Family Medicine | Admitting: Family Medicine

## 2015-06-06 DIAGNOSIS — Z1231 Encounter for screening mammogram for malignant neoplasm of breast: Secondary | ICD-10-CM

## 2016-01-15 ENCOUNTER — Ambulatory Visit (INDEPENDENT_AMBULATORY_CARE_PROVIDER_SITE_OTHER): Payer: BLUE CROSS/BLUE SHIELD | Admitting: Sports Medicine

## 2016-01-15 ENCOUNTER — Encounter: Payer: Self-pay | Admitting: Sports Medicine

## 2016-01-15 DIAGNOSIS — L84 Corns and callosities: Secondary | ICD-10-CM | POA: Diagnosis not present

## 2016-01-15 DIAGNOSIS — M79676 Pain in unspecified toe(s): Secondary | ICD-10-CM | POA: Diagnosis not present

## 2016-01-15 DIAGNOSIS — E119 Type 2 diabetes mellitus without complications: Secondary | ICD-10-CM

## 2016-01-15 DIAGNOSIS — B351 Tinea unguium: Secondary | ICD-10-CM | POA: Diagnosis not present

## 2016-01-15 NOTE — Progress Notes (Deleted)
   Subjective:    Patient ID: Crystal Dudley, female    DOB: 06/06/1971, 45 y.o.   MRN: [id number]  HPI    Review of Systems  Endocrine:       Diabetic   All other systems reviewed and are negative.      Objective:   Physical Exam        Assessment & Plan:

## 2016-01-15 NOTE — Progress Notes (Signed)
Patient ID: Crystal Dudley, female   DOB: 02-Dec-1971, 45 y.o.   MRN: [id number] Subjective: Crystal Dudley is a 45 y.o. female patient with history of type  2 diabetes who presents to office today complaining of painful callus and long, painful nails  while ambulating in shoes; unable to trim. Patient states that the glucose reading this morning was 140m/dl. Dx with Diabetes >10 years ago. Patient denies any new changes in medication or new problems. Patient denies any new cramping, numbness, burning or tingling in the legs.  Patient Active Problem List   Diagnosis Date Noted  . [place] 02/24/2014  . Unspecified essential hypertension 02/24/2014  . Dyslipidemia 02/24/2014   Current Outpatient Prescriptions on File Prior to Visit  Medication Sig Dispense Refill  . aspirin 81 MG tablet Take 81 mg by mouth daily.    . Canagliflozin (INVOKANA) 300 MG TABS Take 1 tablet by mouth daily.    . cetirizine (ZYRTEC) 10 MG tablet Take 10 mg by mouth daily.    . hydrochlorothiazide (HYDRODIURIL) 25 MG tablet Take 25 mg by mouth daily.    . Insulin Glargine (LANTUS SOLOSTAR) 100 UNIT/ML Solostar Pen Inject 70 Units into the skin every morning. And pen needles 2/day 10 pen PRN  . lisinopril (PRINIVIL,ZESTRIL) 40 MG tablet     . ondansetron (ZOFRAN) 4 MG tablet Take 1 tablet (4 mg total) by mouth 4 (four) times daily as needed for nausea or vomiting. 12 tablet 0  . oxyCODONE-acetaminophen (PERCOCET/ROXICET) 5-325 MG per tablet Take 1-2 tablets by mouth every 6 (six) hours as needed for moderate pain or severe pain. 15 tablet 0  . simvastatin (ZOCOR) 40 MG tablet      No current facility-administered medications on file prior to visit.    Objective: General: Patient is awake, alert, and oriented x 3 and in no acute distress.  Integument: Skin is warm, dry and supple bilateral. Nails are tender, long, thickened and  dystrophic with subungual debris, consistent with onychomycosis, 1-5 bilateral.  No signs of infection. No open lesions. + Callus right medial hallux, sub met 1 and sub met 3 with no signs of infection. Remaining integument unremarkable.  Vasculature:  Dorsalis Pedis pulse 2/4 bilateral. Posterior Tibial pulse  1/4 bilateral.  Capillary fill time <3 sec 1-5 bilateral. Scant hair growth to the level of the digits. Temperature gradient within normal limits. No varicosities present bilateral. No edema present bilateral.   Neurology: The patient has intact sensation measured with a 5.07/10g Semmes Weinstein Monofilament at all pedal sites bilateral . Vibratory sensation diminished bilateral with tuning fork. No Babinski sign present bilateral. Subjective occassional tingling in both feet; non-painful.   Musculoskeletal: Asymptomatic mild bunion and hammertoe gross pedal deformities noted bilateral. Muscular strength 5/5 in all lower extremity muscular groups bilateral without pain on range of motion . No tenderness with calf compression bilateral.  Assessment and Plan: Problem List Items Addressed This Visit    None    Visit Diagnoses    Callus of foot    -  Primary    Dermatophytosis of nail        Diabetes mellitus without complication (HCC)        Relevant Medications    INVOKAMET XR 1354-6568MG TLE75   TRULICITY 1.5 MTZ/0.0FVSOPN    TRESIBA FLEXTOUCH 200 UNIT/ML SOPN    pioglitazone (ACTOS) 15 MG tablet      -Examined patient. -Discussed and educated patient on diabetic foot care, especially with  regards to the vascular, neurological and musculoskeletal systems.  -Stressed the importance of good glycemic control and the detriment of not  controlling glucose levels in relation to the foot. -Mechanically debrided callus x 3 on right with sterile chisel blade and all nails 1-5 bilateral using sterile nail nipper and filed with dremel without incident  -Treated callus areas with salinocaine and moleskin to keep intact 1 day -Recommend daily skin emollient and good  supportive shoes with inserts daily -Answered all patient questions -Patient to return in 2-3 months for at risk foot care -Patient advised to call the office if any problems or questions arise in the  Meantime.  Landis Martins, DPM

## 2016-01-15 NOTE — Patient Instructions (Signed)
Diabetes and Foot Care Diabetes may cause you to have problems because of poor blood supply (circulation) to your feet and legs. This may cause the skin on your feet to become thinner, break easier, and heal more slowly. Your skin may become dry, and the skin may peel and crack. You may also have nerve damage in your legs and feet causing decreased feeling in them. You may not notice minor injuries to your feet that could lead to infections or more serious problems. Taking care of your feet is one of the most important things you can do for yourself.  HOME CARE INSTRUCTIONS  Wear shoes at all times, even in the house. Do not go barefoot. Bare feet are easily injured.  Check your feet daily for blisters, cuts, and redness. If you cannot see the bottom of your feet, use a mirror or ask someone for help.  Wash your feet with warm water (do not use hot water) and mild soap. Then pat your feet and the areas between your toes until they are completely dry. Do not soak your feet as this can dry your skin.  Apply a moisturizing lotion or petroleum jelly (that does not contain alcohol and is unscented) to the skin on your feet and to dry, brittle toenails. Do not apply lotion between your toes.  Trim your toenails straight across. Do not dig under them or around the cuticle. File the edges of your nails with an emery board or nail file.  Do not cut corns or calluses or try to remove them with medicine.  Wear clean socks or stockings every day. Make sure they are not too tight. Do not wear knee-high stockings since they may decrease blood flow to your legs.  Wear shoes that fit properly and have enough cushioning. To break in new shoes, wear them for just a few hours a day. This prevents you from injuring your feet. Always look in your shoes before you put them on to be sure there are no objects inside.  Do not cross your legs. This may decrease the blood flow to your feet.  If you find a minor scrape,  cut, or break in the skin on your feet, keep it and the skin around it clean and dry. These areas may be cleansed with mild soap and water. Do not cleanse the area with peroxide, alcohol, or iodine.  When you remove an adhesive bandage, be sure not to damage the skin around it.  If you have a wound, look at it several times a day to make sure it is healing.  Do not use heating pads or hot water bottles. They may burn your skin. If you have lost feeling in your feet or legs, you may not know it is happening until it is too late.  Make sure your health care provider performs a complete foot exam at least annually or more often if you have foot problems. Report any cuts, sores, or bruises to your health care provider immediately. SEEK MEDICAL CARE IF:   You have an injury that is not healing.  You have cuts or breaks in the skin.  You have an ingrown nail.  You notice redness on your legs or feet.  You feel burning or tingling in your legs or feet.  You have pain or cramps in your legs and feet.  Your legs or feet are numb.  Your feet always feel cold. SEEK IMMEDIATE MEDICAL CARE IF:   There is increasing redness,   swelling, or pain in or around a wound.  There is a red line that goes up your leg.  Pus is coming from a wound.  You develop a fever or as directed by your health care provider.  You notice a bad smell coming from an ulcer or wound.   This information is not intended to replace advice given to you by your health care provider. Make sure you discuss any questions you have with your health care provider.   Document Released: 12/06/2000 Document Revised: 08/11/2013 Document Reviewed: 05/18/2013 Elsevier Interactive Patient Education 2016 [employer]  

## 2016-03-18 ENCOUNTER — Ambulatory Visit: Payer: BLUE CROSS/BLUE SHIELD | Admitting: Sports Medicine

## 2017-04-14 DIAGNOSIS — E1169 Type 2 diabetes mellitus with other specified complication: Secondary | ICD-10-CM | POA: Insufficient documentation

## 2017-04-14 DIAGNOSIS — E559 Vitamin D deficiency, unspecified: Secondary | ICD-10-CM | POA: Insufficient documentation

## 2017-04-14 DIAGNOSIS — E119 Type 2 diabetes mellitus without complications: Secondary | ICD-10-CM | POA: Insufficient documentation

## 2017-08-28 ENCOUNTER — Other Ambulatory Visit: Payer: Self-pay | Admitting: Podiatry

## 2017-08-28 ENCOUNTER — Encounter: Payer: Self-pay | Admitting: Podiatry

## 2017-08-28 ENCOUNTER — Ambulatory Visit (INDEPENDENT_AMBULATORY_CARE_PROVIDER_SITE_OTHER): Payer: BLUE CROSS/BLUE SHIELD

## 2017-08-28 ENCOUNTER — Ambulatory Visit (INDEPENDENT_AMBULATORY_CARE_PROVIDER_SITE_OTHER): Payer: BLUE CROSS/BLUE SHIELD | Admitting: Podiatry

## 2017-08-28 VITALS — BP 105/66 | HR 105 | Temp 99.7°F | Resp 16

## 2017-08-28 DIAGNOSIS — M79674 Pain in right toe(s): Secondary | ICD-10-CM

## 2017-08-28 DIAGNOSIS — L97519 Non-pressure chronic ulcer of other part of right foot with unspecified severity: Secondary | ICD-10-CM

## 2017-08-28 DIAGNOSIS — M869 Osteomyelitis, unspecified: Secondary | ICD-10-CM | POA: Diagnosis not present

## 2017-08-28 NOTE — Patient Instructions (Signed)
Pre-Operative Instructions  Congratulations, you have decided to take an important step towards improving your quality of life.  You can be assured that the doctors and staff at [facility] will be with you every step of the way.  Here are some important things you should know:  1. Plan to be at the surgery center/hospital at least 1 (one) hour prior to your scheduled time, unless otherwise directed by the surgical center/hospital staff.  You must have a responsible adult accompany you, remain during the surgery and drive you home.  Make sure you have directions to the surgical center/hospital to ensure you arrive on time. 2. If you are having surgery at [facility] or [facility] hospitals, you will need a copy of your medical history and physical form from your family physician within one month prior to the date of surgery. We will give you a form for your primary physician to complete.  3. We make every effort to accommodate the date you request for surgery.  However, there are times where surgery dates or times have to be moved.  We will contact you as soon as possible if a change in schedule is required.   4. No aspirin/ibuprofen for one week before surgery.  If you are on aspirin, any non-steroidal anti-inflammatory medications (Mobic, Aleve, Ibuprofen) should not be taken seven (7) days prior to your surgery.  You make take Tylenol for pain prior to surgery.  5. Medications - If you are taking daily heart and blood pressure medications, seizure, reflux, allergy, asthma, anxiety, pain or diabetes medications, make sure you notify the surgery center/hospital before the day of surgery so they can tell you which medications you should take or avoid the day of surgery. 6. No food or drink after midnight the night before surgery unless directed otherwise by surgical center/hospital staff. 7. No alcoholic beverages 24-hours prior to surgery.  No smoking 24-hours prior or 24-hours after  surgery. 8. Wear loose pants or shorts. They should be loose enough to fit over bandages, boots, and casts. 9. Don't wear slip-on shoes. Sneakers are preferred. 10. Bring your boot with you to the surgery center/hospital.  Also bring crutches or a walker if your physician has prescribed it for you.  If you do not have this equipment, it will be provided for you after surgery. 11. If you have not been contacted by the surgery center/hospital by the day before your surgery, call to confirm the date and time of your surgery. 12. Leave-time from work may vary depending on the type of surgery you have.  Appropriate arrangements should be made prior to surgery with your employer. 13. Prescriptions will be provided immediately following surgery by your doctor.  Fill these as soon as possible after surgery and take the medication as directed. Pain medications will not be refilled on weekends and must be approved by the doctor. 14. Remove nail polish on the operative foot and avoid getting pedicures prior to surgery. 15. Wash the night before surgery.  The night before surgery wash the foot and leg well with water and the antibacterial soap provided. Be sure to pay special attention to beneath the toenails and in between the toes.  Wash for at least three (3) minutes. Rinse thoroughly with water and dry well with a towel.  Perform this wash unless told not to do so by your physician.  Enclosed: 1 Ice pack (please put in freezer the night before surgery)   1 Hibiclens skin cleaner     Pre-op instructions  If you have any questions regarding the instructions, please do not hesitate to call our office.  [place]: [address], [place], [place] [address] -- [phone]  [place]: [address] [place], [place] [address] -- [phone]  [place]: [address]  [place], [place] [address] -- [phone]  [place]: [address], [address], [place], [place] [address] -- [phone]  Website:  [email or web address] 

## 2017-08-29 ENCOUNTER — Telehealth: Payer: Self-pay | Admitting: *Deleted

## 2017-08-29 MED ORDER — CIPROFLOXACIN HCL 250 MG PO TABS: 250.0000 mg | ORAL_TABLET | Freq: Two times a day (BID) | ORAL | 0 refills | Status: DC

## 2017-08-29 MED ORDER — CLINDAMYCIN HCL 150 MG PO CAPS: 150.0000 mg | ORAL_CAPSULE | Freq: Three times a day (TID) | ORAL | 0 refills | Status: DC

## 2017-08-29 NOTE — Telephone Encounter (Addendum)
Pt was seen yesterday and her antibiotic is not at the pharmacy. Left message informing pt the antibiotics had been sent to the [facility] today at 11:46am.

## 2017-08-29 NOTE — Telephone Encounter (Signed)
Apologies. Sent it over. Please inform.

## 2017-09-01 ENCOUNTER — Telehealth: Payer: Self-pay | Admitting: *Deleted

## 2017-09-01 NOTE — Telephone Encounter (Signed)
I left patient a message to give me a call regarding her appointment for Wednesday that's going to have to be canceled due to concerns from the nurse at the facility.

## 2017-09-01 NOTE — Telephone Encounter (Addendum)
"  This patient is scheduled for surgery on Wednesday.  I just spoke to her and she said she has a lot of drainage.  She said she goes through 2 bandages a day soaked with drainage.  We're going to need a culture to be performed on this patient before we can allow surgery to be done.  We need this within the next 48 hours or we will have to cancel the surgery or you can do it at the hospital."  I will let Dr. Samuella CotaPrice know.  Aram BeechamCynthia called from [facility] and asked if we were going to cancel the patient's surgery.  I told her I haven't talked to the patient.  I told her I would call her back.

## 2017-09-02 ENCOUNTER — Ambulatory Visit: Payer: BLUE CROSS/BLUE SHIELD | Admitting: Sports Medicine

## 2017-09-02 NOTE — Progress Notes (Signed)
   Subjective:    Patient ID: Crystal Dudley, female    DOB: 12/26/1970, 46 y.o.   MRN: [id number]  HPI Chief Complaint  Patient presents with  . Wound Check    Right foot; great toe-bottom of toe; pt stated, "Just got ulcer this week; just opened up on Tuesday, August 25, 2017"; Pt Diabetic Type 2; Sugar=did not check today; A1C=6.8    46 y.o. female returns for the above complaint. States the wound has been present for a week. Reports heavy drainage and some local redness at the tip of the toe. Denies N/V/F/Ch.  Review of Systems     Objective:   Physical Exam Vitals:   08/28/17 1310  BP: 105/66  Pulse: (!) 105  Resp: 16  Temp: 99.7 F (37.6 C)   General AA&O x3. Normal mood and affect.  Vascular Dorsalis pedis and posterior tibial pulses  present 1+ bilaterally  Capillary refill normal to all digits. Pedal hair growth absent.  Neurologic Epicritic sensation grossly present. Protective sensation absent  Dermatologic (Wound) Wound Location: Rt. foot hallux Wound Measurement: 2 x 2 x 0.2 with central probing to bone. Wound Base: Mixed Granular/Fibrotic Peri-wound: Calloused Exudate: None: wound tissue dry  Orthopedic: MMT 5/5 in dorsiflexion, plantarflexion, inversion, and eversion. Normal lower extremity joint ROM without pain or crepitus.   Radiographs: Taken and reviewed. Erosions noted to the distal phalanx of the hallux.     Assessment & Plan:  Osteomyelitis, R Hallux -XR reviewed with patient. -Discussed findings of osteomyelitis. Discussed treatment options including amputation of the digit vs extended IV abx therapy. Discussed with patient that amputation is more definitive and that IV abx are not guaranteed to resolve the infection. Patient understands and wishes to proceed with amputation. -Consent reviewed with patient and signed. -Will plan for amputation next week.  20 minutes of face to face time were spent with the patient. >50% of this was spent on  counseling and coordination of care. Specifically discussed with patient the diagnosis of osteomyelitis and treatment options as above. Will proceed with amputation.  Return for eval after surgery.

## 2017-09-02 NOTE — Telephone Encounter (Signed)
I am calling to let you know we have to change locations for your surgery.  Due to you having drainage [facility] will not allow the surgery to be done there.  Your surgery will have to be done at [facility].  A stipulation with [facility] is that you must have a history and physical form completed by your primary care physician.  The physical must be done within 30 days to surgery date.  Have you had a physical recently within 30 days?  "Yes, I had a physical done on September 5."  Can you come by to pick up the form?  "Do you mind faxing it to me at [phone]?"  I will send it.  I'll let you know when I get it scheduled.  We're going to try to get it scheduled this week.  I faxed the form to the patient.   I am calling to let you know.  Your surgery is scheduled for Friday at 12:30pm.  You will probably have to be there two hours early.  Someone from the surgical center will call with the exact arrival time a day or two prior to surgery date.  Make sure you take your history and physical form to your primary care doctor to have it filled out and faxed to [facility] and a copy to us as well.

## 2017-09-02 NOTE — Telephone Encounter (Signed)
Can we have patient see her PCP for clearance and arrange a date for the procedure to be performed at the hospital? Other than Wednesdays I could do it around noon on Thursdays/Fridays or prior to office hours.

## 2017-09-03 ENCOUNTER — Encounter: Payer: Self-pay | Admitting: Podiatry

## 2017-09-03 DIAGNOSIS — E11621 Type 2 diabetes mellitus with foot ulcer: Secondary | ICD-10-CM | POA: Diagnosis not present

## 2017-09-03 DIAGNOSIS — M86679 Other chronic osteomyelitis, unspecified ankle and foot: Secondary | ICD-10-CM | POA: Diagnosis not present

## 2017-09-04 ENCOUNTER — Telehealth: Payer: Self-pay | Admitting: *Deleted

## 2017-09-04 ENCOUNTER — Encounter (HOSPITAL_COMMUNITY): Payer: Self-pay | Admitting: *Deleted

## 2017-09-04 NOTE — Anesthesia Preprocedure Evaluation (Addendum)
Anesthesia Evaluation  Patient identified by MRN, date of birth, ID band Patient awake    Reviewed: Allergy & Precautions, NPO status , Patient's Chart, lab work & pertinent test results  Airway Mallampati: II  TM Distance: >3 FB Neck ROM: Full    Dental  (+) Dental Advisory Given, Teeth Intact   Pulmonary former smoker,    Pulmonary exam normal breath sounds clear to auscultation       Cardiovascular hypertension, Normal cardiovascular exam Rhythm:Regular Rate:Normal     Neuro/Psych negative neurological ROS  negative psych ROS   GI/Hepatic negative GI ROS, Neg liver ROS,   Endo/Other  diabetes, Insulin DependentObesity   Renal/GU negative Renal ROS  negative genitourinary   Musculoskeletal negative musculoskeletal ROS (+)   Abdominal   Peds  Hematology negative hematology ROS (+)   Anesthesia Other Findings   Reproductive/Obstetrics                            Anesthesia Physical Anesthesia Plan  ASA: III  Anesthesia Plan: MAC   Post-op Pain Management:    Induction: Intravenous  PONV Risk Score and Plan: 3 and Ondansetron, Dexamethasone, Midazolam, Propofol infusion and Treatment may vary due to age or medical condition  Airway Management Planned: Nasal Cannula  Additional Equipment: None  Intra-op Plan:   Post-operative Plan:   Informed Consent: I have reviewed the patients History and Physical, chart, labs and discussed the procedure including the risks, benefits and alternatives for the proposed anesthesia with the patient or authorized representative who has indicated his/her understanding and acceptance.   Dental advisory given  Plan Discussed with: CRNA  Anesthesia Plan Comments:         Anesthesia Quick Evaluation

## 2017-09-04 NOTE — Telephone Encounter (Addendum)
I'm returning your call.  You should hear something from the surgical center soon.  "I just talked to them about 30 minutes ago."  They said they still haven't received your history and physical form.  "I will call my doctor's office on my next break and see what's going on."

## 2017-09-04 NOTE — Progress Notes (Signed)
I instructed patient to check CBG after awaking and every 2 hours until arrival  to the hospital.  I Instructed patient if CBG is less than 70 to take  1/2 cup of a clear juice. Recheck CBG in 15 minutes then call pre- op desk at [phone] for further instructions. If scheduled to receive Insulin, do not take Insulin.  If CBG > 70 take 40 units of Tresiba, no other medications.

## 2017-09-04 NOTE — Telephone Encounter (Signed)
"  I'm supposed to be having surgery on tomorrow at the hospital.  Nobody has called about a time.  I was just calling to see if you knew what time.  If you would, please give me a call back."

## 2017-09-04 NOTE — Telephone Encounter (Signed)
"  I'm calling to request orders on Crystal Dudley, that's with Dr. Samuella CotaPrice.  I also need a history/physical.  Do we have one coming with her?  I see she was seen in August from a medical doctor but I do not see a history/physical within 30 days.  Please let me know where we stand with those things."

## 2017-09-05 ENCOUNTER — Ambulatory Visit (HOSPITAL_COMMUNITY)
Admission: RE | Admit: 2017-09-05 | Discharge: 2017-09-05 | Disposition: A | Discharge status: Home / Self Care | Payer: BLUE CROSS/BLUE SHIELD | Source: Ambulatory Visit | Attending: Podiatry | Admitting: Podiatry

## 2017-09-05 ENCOUNTER — Encounter: Payer: BLUE CROSS/BLUE SHIELD | Admitting: Podiatry

## 2017-09-05 ENCOUNTER — Ambulatory Visit (HOSPITAL_COMMUNITY): Payer: BLUE CROSS/BLUE SHIELD | Admitting: Anesthesiology

## 2017-09-05 ENCOUNTER — Encounter (HOSPITAL_COMMUNITY)
Admission: RE | Disposition: A | Discharge status: Home / Self Care | Payer: Self-pay | Source: Ambulatory Visit | Attending: Podiatry

## 2017-09-05 ENCOUNTER — Encounter (HOSPITAL_COMMUNITY): Payer: Self-pay | Admitting: *Deleted

## 2017-09-05 DIAGNOSIS — M869 Osteomyelitis, unspecified: Secondary | ICD-10-CM

## 2017-09-05 DIAGNOSIS — Z9889 Other specified postprocedural states: Secondary | ICD-10-CM

## 2017-09-05 DIAGNOSIS — E1169 Type 2 diabetes mellitus with other specified complication: Secondary | ICD-10-CM | POA: Insufficient documentation

## 2017-09-05 DIAGNOSIS — M868X7 Other osteomyelitis, ankle and foot: Secondary | ICD-10-CM | POA: Diagnosis not present

## 2017-09-05 DIAGNOSIS — L97519 Non-pressure chronic ulcer of other part of right foot with unspecified severity: Secondary | ICD-10-CM | POA: Insufficient documentation

## 2017-09-05 DIAGNOSIS — Z87891 Personal history of nicotine dependence: Secondary | ICD-10-CM | POA: Insufficient documentation

## 2017-09-05 DIAGNOSIS — E11621 Type 2 diabetes mellitus with foot ulcer: Secondary | ICD-10-CM | POA: Diagnosis not present

## 2017-09-05 DIAGNOSIS — I10 Essential (primary) hypertension: Secondary | ICD-10-CM | POA: Diagnosis not present

## 2017-09-05 HISTORY — PX: AMPUTATION TOE: SHX6595

## 2017-09-05 LAB — BASIC METABOLIC PANEL
Anion gap: 9 (ref 5–15)
BUN: 20 mg/dL (ref 6–20)
CO2: 21 mmol/L — ABNORMAL LOW (ref 22–32)
Calcium: 8.9 mg/dL (ref 8.9–10.3)
Chloride: 105 mmol/L (ref 101–111)
Creatinine, Ser: 0.72 mg/dL (ref 0.44–1.00)
GFR calc Af Amer: >60 mL/min (ref >60)
GFR calc non Af Amer: >60 mL/min (ref >60)
Glucose, Bld: 75 mg/dL (ref 65–99)
Potassium: 4.4 mmol/L (ref 3.5–5.1)
Sodium: 135 mmol/L (ref 135–145)

## 2017-09-05 LAB — GLUCOSE, CAPILLARY
GLUCOSE-CAPILLARY: 75 mg/dL (ref 65–99)
Glucose-Capillary: 88 mg/dL (ref 65–99)
Glucose-Capillary: 86 mg/dL (ref 65–99)

## 2017-09-05 LAB — CBC
HCT: 35.2 % — ABNORMAL LOW (ref 36.0–46.0)
Hemoglobin: 11.2 g/dL — ABNORMAL LOW (ref 12.0–15.0)
MCH: 28.3 pg (ref 26.0–34.0)
MCHC: 31.8 g/dL (ref 30.0–36.0)
MCV: 88.9 fL (ref 78.0–100.0)
Platelets: 410 10*3/uL — ABNORMAL HIGH (ref 150–400)
RBC: 3.96 MIL/uL (ref 3.87–5.11)
RDW: 15.3 % (ref 11.5–15.5)
WBC: 9.7 10*3/uL (ref 4.0–10.5)

## 2017-09-05 LAB — HEMOGLOBIN A1C
Hgb A1c MFr Bld: 6.6 % — ABNORMAL HIGH (ref 4.8–5.6)
MEAN PLASMA GLUCOSE: 142.72 mg/dL

## 2017-09-05 LAB — HCG, SERUM, QUALITATIVE: Preg, Serum: NEGATIVE

## 2017-09-05 SURGERY — AMPUTATION, TOE
Anesthesia: Monitor Anesthesia Care | Site: Toe | Laterality: Right

## 2017-09-05 MED ORDER — CIPROFLOXACIN HCL 250 MG PO TABS: 250.0000 mg | ORAL_TABLET | Freq: Two times a day (BID) | ORAL | 0 refills | Status: DC

## 2017-09-05 MED ORDER — ONDANSETRON HCL 4 MG PO TABS: 4.0000 mg | ORAL_TABLET | Freq: Three times a day (TID) | ORAL | 0 refills | Status: DC | PRN

## 2017-09-05 MED ORDER — DEXAMETHASONE SODIUM PHOSPHATE 10 MG/ML IJ SOLN
INTRAMUSCULAR | Status: AC
  Filled 2017-09-05: qty 1

## 2017-09-05 MED ORDER — DEXAMETHASONE SODIUM PHOSPHATE 10 MG/ML IJ SOLN
INTRAMUSCULAR | Status: DC | PRN
  Administered 2017-09-05: 5 mg via INTRAVENOUS

## 2017-09-05 MED ORDER — CLINDAMYCIN HCL 150 MG PO CAPS: 150.0000 mg | ORAL_CAPSULE | Freq: Three times a day (TID) | ORAL | 0 refills | Status: DC

## 2017-09-05 MED ORDER — CEFAZOLIN SODIUM 1 G IJ SOLR
INTRAMUSCULAR | Status: AC
  Filled 2017-09-05: qty 20

## 2017-09-05 MED ORDER — ONDANSETRON HCL 4 MG/2ML IJ SOLN
INTRAMUSCULAR | Status: AC
  Filled 2017-09-05: qty 2

## 2017-09-05 MED ORDER — OXYCODONE-ACETAMINOPHEN 5-325 MG PO TABS: 1.0000 | ORAL_TABLET | ORAL | 0 refills | Status: DC | PRN

## 2017-09-05 MED ORDER — PROMETHAZINE HCL 25 MG/ML IJ SOLN: 6.2500 mg | INTRAMUSCULAR | Status: DC | PRN

## 2017-09-05 MED ORDER — FENTANYL CITRATE (PF) 250 MCG/5ML IJ SOLN
INTRAMUSCULAR | Status: AC
  Filled 2017-09-05: qty 5

## 2017-09-05 MED ORDER — CEFAZOLIN SODIUM-DEXTROSE 2-3 GM-% IV SOLR
INTRAVENOUS | Status: DC | PRN
  Administered 2017-09-05: 2 g via INTRAVENOUS

## 2017-09-05 MED ORDER — FENTANYL CITRATE (PF) 100 MCG/2ML IJ SOLN: 25.0000 ug | INTRAMUSCULAR | Status: DC | PRN

## 2017-09-05 MED ORDER — PROPOFOL 10 MG/ML IV BOLUS
INTRAVENOUS | Status: AC
  Filled 2017-09-05: qty 20

## 2017-09-05 MED ORDER — ONDANSETRON HCL 4 MG/2ML IJ SOLN
INTRAMUSCULAR | Status: DC | PRN
  Administered 2017-09-05 (×2): 4 mg via INTRAVENOUS

## 2017-09-05 MED ORDER — FENTANYL CITRATE (PF) 100 MCG/2ML IJ SOLN
INTRAMUSCULAR | Status: DC | PRN
  Administered 2017-09-05: 100 ug via INTRAVENOUS

## 2017-09-05 MED ORDER — BUPIVACAINE HCL (PF) 0.5 % IJ SOLN
INTRAMUSCULAR | Status: DC | PRN
  Administered 2017-09-05: 10 mL

## 2017-09-05 MED ORDER — VANCOMYCIN HCL POWD
Status: DC | PRN
  Administered 2017-09-05: 1000 mg via TOPICAL

## 2017-09-05 MED ORDER — MIDAZOLAM HCL 2 MG/2ML IJ SOLN
INTRAMUSCULAR | Status: AC
  Filled 2017-09-05: qty 2

## 2017-09-05 MED ORDER — VANCOMYCIN HCL 1000 MG IV SOLR
INTRAVENOUS | Status: AC
  Filled 2017-09-05: qty 1000

## 2017-09-05 MED ORDER — BUPIVACAINE HCL (PF) 0.5 % IJ SOLN
INTRAMUSCULAR | Status: AC
  Filled 2017-09-05: qty 30

## 2017-09-05 MED ORDER — PROPOFOL 500 MG/50ML IV EMUL
INTRAVENOUS | Status: DC | PRN
  Administered 2017-09-05: 50 ug/kg/min via INTRAVENOUS

## 2017-09-05 MED ORDER — LACTATED RINGERS IV SOLN
INTRAVENOUS | Status: DC
  Administered 2017-09-05: 10:00:00 via INTRAVENOUS

## 2017-09-05 MED ORDER — MIDAZOLAM HCL 5 MG/5ML IJ SOLN
INTRAMUSCULAR | Status: DC | PRN
  Administered 2017-09-05: 2 mg via INTRAVENOUS

## 2017-09-05 MED ORDER — PROPOFOL 10 MG/ML IV BOLUS
INTRAVENOUS | Status: DC | PRN
  Administered 2017-09-05 (×2): 20 mg via INTRAVENOUS

## 2017-09-05 MED ORDER — PROPOFOL 1000 MG/100ML IV EMUL
INTRAVENOUS | Status: AC
  Filled 2017-09-05: qty 100

## 2017-09-05 MED ORDER — CEFAZOLIN SODIUM-DEXTROSE 1-4 GM/50ML-% IV SOLN: INTRAVENOUS | Status: DC | PRN

## 2017-09-05 SURGICAL SUPPLY — 38 items
BANDAGE ACE 4X5 VEL STRL LF (GAUZE/BANDAGES/DRESSINGS) ×2 IMPLANT
BNDG CMPR 9X4 STRL LF SNTH (GAUZE/BANDAGES/DRESSINGS) ×1
BNDG ESMARK 4X9 LF (GAUZE/BANDAGES/DRESSINGS) ×2 IMPLANT
BNDG GAUZE ELAST 4 BULKY (GAUZE/BANDAGES/DRESSINGS) ×2 IMPLANT
CHLORAPREP W/TINT 26ML (MISCELLANEOUS) ×3 IMPLANT
COVER SURGICAL LIGHT HANDLE (MISCELLANEOUS) ×3 IMPLANT
CUFF TOURNIQUET SINGLE 18IN (TOURNIQUET CUFF) ×2 IMPLANT
CUFF TOURNIQUET SINGLE 34IN LL (TOURNIQUET CUFF) IMPLANT
DRAPE U-SHAPE 47X51 STRL (DRAPES) ×3 IMPLANT
ELECT CAUTERY BLADE 6.4 (BLADE) ×3 IMPLANT
ELECT REM PT RETURN 9FT ADLT (ELECTROSURGICAL) ×3
ELECTRODE REM PT RTRN 9FT ADLT (ELECTROSURGICAL) ×1 IMPLANT
GAUZE SPONGE 4X4 12PLY STRL (GAUZE/BANDAGES/DRESSINGS) ×2 IMPLANT
GAUZE VASELINE 1X8 (GAUZE/BANDAGES/DRESSINGS) ×2 IMPLANT
GLOVE BIO SURGEON STRL SZ7.5 (GLOVE) ×3 IMPLANT
GLOVE BIOGEL PI IND STRL 8 (GLOVE) ×1 IMPLANT
GLOVE BIOGEL PI INDICATOR 8 (GLOVE) ×2
GOWN STRL REUS W/ TWL LRG LVL3 (GOWN DISPOSABLE) ×1 IMPLANT
GOWN STRL REUS W/ TWL XL LVL3 (GOWN DISPOSABLE) ×1 IMPLANT
GOWN STRL REUS W/TWL LRG LVL3 (GOWN DISPOSABLE) ×3
GOWN STRL REUS W/TWL XL LVL3 (GOWN DISPOSABLE) ×3
KIT BASIN OR (CUSTOM PROCEDURE TRAY) ×3 IMPLANT
KIT ROOM TURNOVER OR (KITS) ×3 IMPLANT
MANIFOLD NEPTUNE II (INSTRUMENTS) ×3 IMPLANT
NDL HYPO 25GX1X1/2 BEV (NEEDLE) IMPLANT
NEEDLE HYPO 25GX1X1/2 BEV (NEEDLE) IMPLANT
NS IRRIG 1000ML POUR BTL (IV SOLUTION) ×3 IMPLANT
PACK ORTHO EXTREMITY (CUSTOM PROCEDURE TRAY) ×3 IMPLANT
PAD ARMBOARD 7.5X6 YLW CONV (MISCELLANEOUS) ×6 IMPLANT
SCRUB BETADINE 4OZ XXX (MISCELLANEOUS) ×3 IMPLANT
SET CYSTO W/LG BORE CLAMP LF (SET/KITS/TRAYS/PACK) ×3 IMPLANT
SOL PREP POV-IOD 4OZ 10% (MISCELLANEOUS) ×3 IMPLANT
SYR CONTROL 10ML LL (SYRINGE) IMPLANT
TOWEL GREEN STERILE (TOWEL DISPOSABLE) ×3 IMPLANT
TOWEL GREEN STERILE FF (TOWEL DISPOSABLE) ×3 IMPLANT
TUBE CONNECTING 12'X1/4 (SUCTIONS) ×1
TUBE CONNECTING 12X1/4 (SUCTIONS) ×2 IMPLANT
YANKAUER SUCT BULB TIP NO VENT (SUCTIONS) ×3 IMPLANT

## 2017-09-05 NOTE — Interval H&P Note (Signed)
History and Physical Interval Note:  09/05/2017 11:18 AM  Crystal Dudley  has presented today for surgery, with the diagnosis of OSTEOMYLITIS  The various methods of treatment have been discussed with the patient and family. After consideration of risks, benefits and other options for treatment, the patient has consented to  Procedure(s): AMPUTATION TOE (Right) as a surgical intervention .  The patient's history has been reviewed, patient examined, no change in status, stable for surgery.  I have reviewed the patient's chart and labs.  Questions were answered to the patient's satisfaction.     Park Liter

## 2017-09-05 NOTE — Progress Notes (Signed)
Orthopedic Tech Progress Note Patient Details:  Crystal Dudley 12-07-1971 [id number]  Ortho Devices Type of Ortho Device: Crutches   CATALYNA REILLY 09/05/2017, 1:43 PM

## 2017-09-05 NOTE — Anesthesia Postprocedure Evaluation (Signed)
Anesthesia Post Note  Patient: Crystal Dudley  Procedure(s) Performed: Procedure(s) (LRB): AMPUTATION TOE (Right)     Patient location during evaluation: PACU Anesthesia Type: MAC Level of consciousness: awake and alert Pain management: pain level controlled Vital Signs Assessment: post-procedure vital signs reviewed and stable Respiratory status: spontaneous breathing, nonlabored ventilation and respiratory function stable Cardiovascular status: stable and blood pressure returned to baseline Postop Assessment: no apparent nausea or vomiting Anesthetic complications: no    Last Vitals:  Vitals:   09/05/17 1330 09/05/17 1337  BP:  111/65  Pulse: 71 67  Resp: 17 17  Temp:    SpO2: 97% 98%    Last Pain:  Vitals:   09/05/17 1253  PainSc: 0-No pain                 Audry Pili

## 2017-09-05 NOTE — H&P (View-Only) (Signed)
   Subjective:    Patient ID: Crystal Dudley, female    DOB: 09/16/1971, 46 y.o.   MRN: [id number]  HPI Chief Complaint  Patient presents with  . Wound Check    Right foot; great toe-bottom of toe; pt stated, "Just got ulcer this week; just opened up on Tuesday, August 25, 2017"; Pt Diabetic Type 2; Sugar=did not check today; A1C=6.8    46 y.o. female returns for the above complaint. States the wound has been present for a week. Reports heavy drainage and some local redness at the tip of the toe. Denies N/V/F/Ch.  Review of Systems     Objective:   Physical Exam Vitals:   08/28/17 1310  BP: 105/66  Pulse: (!) 105  Resp: 16  Temp: 99.7 F (37.6 C)   General AA&O x3. Normal mood and affect.  Vascular Dorsalis pedis and posterior tibial pulses  present 1+ bilaterally  Capillary refill normal to all digits. Pedal hair growth absent.  Neurologic Epicritic sensation grossly present. Protective sensation absent  Dermatologic (Wound) Wound Location: Rt. foot hallux Wound Measurement: 2 x 2 x 0.2 with central probing to bone. Wound Base: Mixed Granular/Fibrotic Peri-wound: Calloused Exudate: None: wound tissue dry  Orthopedic: MMT 5/5 in dorsiflexion, plantarflexion, inversion, and eversion. Normal lower extremity joint ROM without pain or crepitus.   Radiographs: Taken and reviewed. Erosions noted to the distal phalanx of the hallux.     Assessment & Plan:  Osteomyelitis, R Hallux -XR reviewed with patient. -Discussed findings of osteomyelitis. Discussed treatment options including amputation of the digit vs extended IV abx therapy. Discussed with patient that amputation is more definitive and that IV abx are not guaranteed to resolve the infection. Patient understands and wishes to proceed with amputation. -Consent reviewed with patient and signed. -Will plan for amputation next week.  20 minutes of face to face time were spent with the patient. >50% of this was spent on  counseling and coordination of care. Specifically discussed with patient the diagnosis of osteomyelitis and treatment options as above. Will proceed with amputation.  Return for eval after surgery.  

## 2017-09-05 NOTE — Anesthesia Procedure Notes (Signed)
Procedure Name: MAC Date/Time: 09/05/2017 12:09 PM Performed by: Barrington Ellison Pre-anesthesia Checklist: Patient identified, Emergency Drugs available, Suction available, Patient being monitored and Timeout performed Oxygen Delivery Method: Simple face mask

## 2017-09-05 NOTE — Brief Op Note (Signed)
09/05/2017  12:48 PM  PATIENT:  Crystal Dudley  46 y.o. female  PRE-OPERATIVE DIAGNOSIS:  OSTEOMYLITIS  POST-OPERATIVE DIAGNOSIS:  OSTEOMYLITIS  PROCEDURE:  Procedure(s): AMPUTATION TOE (Right)  SURGEON:  Surgeon(s) and Role:    * Evelina Bucy, DPM - Primary  PHYSICIAN ASSISTANT:   ASSISTANTS: none   ANESTHESIA:   local and MAC  EBL:  No intake/output data recorded.  BLOOD ADMINISTERED:none  DRAINS: none   LOCAL MEDICATIONS USED:  MARCAINE     SPECIMEN:  Source of Specimen:  R Great Toe  DISPOSITION OF SPECIMEN:  PATHOLOGY  COUNTS:  YES  TOURNIQUET:   Total Tourniquet Time Documented: Calf (Right) - 18 minutes Total: Calf (Right) - 18 minutes   DICTATION: .Note written in EPIC  PLAN OF CARE: Discharge to home after PACU  PATIENT DISPOSITION:  PACU - hemodynamically stable.   Delay start of Pharmacological VTE agent (>24hrs) due to surgical blood loss or risk of bleeding: not applicable

## 2017-09-05 NOTE — Transfer of Care (Signed)
Immediate Anesthesia Transfer of Care Note  Patient: Crystal Dudley  Procedure(s) Performed: Procedure(s): AMPUTATION TOE (Right)  Patient Location: PACU  Anesthesia Type:MAC  Level of Consciousness: awake and oriented  Airway & Oxygen Therapy: Patient Spontanous Breathing  Post-op Assessment: Report given to RN  Post vital signs: Reviewed and stable  Last Vitals:  Vitals:   09/05/17 1011  BP: 109/72  Pulse: 93  Resp: 18  Temp: 36.9 C  SpO2: 100%    Last Pain: There were no vitals filed for this visit.       Complications: No apparent anesthesia complications

## 2017-09-05 NOTE — Interval H&P Note (Signed)
Anesthesia H&P Update: History and Physical Exam reviewed; patient is OK for planned anesthetic and procedure. ? ?

## 2017-09-05 NOTE — Op Note (Signed)
Patient Name: Crystal Dudley DOB: 1971/05/17  MRN: [id number]  Date of Service: 09/05/17   Surgeon: Dr. Hardie Pulley, DPM Assistants: None Pre-operative Diagnosis: Osteomyelitis R great toe Post-operative Diagnosis: same Procedures:             1) Amputation R Great Toe - IPJ Level (CPT [phone]) Pathology/Specimens:             1) R Great toe - pathology  2) Deep soft tissue R foot - microbiology Anesthesia: MAC/Local Hemostasis: Anatomic Estimated Blood Loss: 2 Materials: None Medications: 0.5 g Vancomycin powder topically. Complications: None  Indications for Procedure: This is a 46 y.o. female who was being treated at our office for a R great toe wound. XR were taken and indicated erosion of the distal phalanx of the hallux. It was discussed with the patient that she would benefit from amputation of the digit for resolution of infection. Patient understood and agreed to proceed.  Procedure in Detail: Patient was identified in pre-operative holding area. Formal consent was signed and the right lower extremity was marked. Patient was brought back to the operating room and placed on the operating room table in the supine position. Anesthesia was induced. The right lower extremity was prepped and draped in the usual sterile fashion. Timeout was taken to confirm patient name, laterality, and procedure prior to incision. Attention was then directed to the right foot. The foot was held up for two minutes for gravity exanguination. The tourniquet was then inflated. A fish mouth incision was made about the R great toe interphalangeal joint. Dissection was carried down to bone. The toe was disarticulated at the interphalangeal joint. This was sent for pathology. The remaining head of the proximal phalanx of the hallux appeared healthy and viable. The area was copiously irrigated with 1L NS. A deep soft tissue sample was taken with a clean rongeur for microbiology. After this, 0.5 g Vancomycin  powder was then applied to the wound bed. The incision was then closed with 3-0 nylon. The foot was then dressed with adaptic, 4x4, kerlix, and ACE bandage. Patient tolerated the procedure well.  Disposition: Following a period of post-operative monitoring, patient will be transferred back home. She will follow up next week for a wound check.

## 2017-09-06 ENCOUNTER — Encounter (HOSPITAL_COMMUNITY): Payer: Self-pay | Admitting: Podiatry

## 2017-09-06 ENCOUNTER — Telehealth: Payer: Self-pay | Admitting: Podiatry

## 2017-09-06 NOTE — Telephone Encounter (Signed)
Post-op check on patient. Patient doing well, pain controlled. No acute issues.

## 2017-09-08 MED FILL — Vancomycin HCl For IV Soln 1 GM (Base Equivalent): INTRAVENOUS | Qty: 1000 | Status: AC

## 2017-09-10 ENCOUNTER — Ambulatory Visit (INDEPENDENT_AMBULATORY_CARE_PROVIDER_SITE_OTHER): Payer: BLUE CROSS/BLUE SHIELD | Admitting: Podiatry

## 2017-09-10 ENCOUNTER — Encounter: Payer: Self-pay | Admitting: Podiatry

## 2017-09-10 VITALS — BP 136/77 | HR 87 | Temp 98.8°F | Resp 18

## 2017-09-10 DIAGNOSIS — B351 Tinea unguium: Secondary | ICD-10-CM | POA: Diagnosis not present

## 2017-09-10 DIAGNOSIS — Z9889 Other specified postprocedural states: Secondary | ICD-10-CM

## 2017-09-10 DIAGNOSIS — M792 Neuralgia and neuritis, unspecified: Secondary | ICD-10-CM

## 2017-09-10 LAB — AEROBIC/ANAEROBIC CULTURE W GRAM STAIN (SURGICAL/DEEP WOUND): Culture: NO GROWTH

## 2017-09-10 LAB — AEROBIC/ANAEROBIC CULTURE (SURGICAL/DEEP WOUND)

## 2017-09-10 MED ORDER — OXYCODONE-ACETAMINOPHEN 5-325 MG PO TABS: 1.0000 | ORAL_TABLET | ORAL | 0 refills | Status: DC | PRN

## 2017-09-10 MED ORDER — GABAPENTIN 300 MG PO CAPS: 300.0000 mg | ORAL_CAPSULE | Freq: Three times a day (TID) | ORAL | 3 refills | Status: DC

## 2017-09-10 NOTE — Progress Notes (Signed)
   Subjective:    Patient ID: Crystal Dudley, female    DOB: 1971-07-14, 46 y.o.   MRN: [id number]  HPI Chief Complaint  Patient presents with  . Routine Post Op    i had surgery on 09-05-17 and i am doing alot better on my right foot     46 y.o. female returns for the above complaint. Pain well controlled with medications. Denies N/V/F/Ch. Has kept dressing intact. No complaints.  Review of Systems     Objective:   Physical Exam General AA&O x3. Normal mood and affect.  Vascular Foot warm and well perfused.  Neurologic Gross sensation intact.  Dermatologic Skin healing well without signs of infection. Incision well coapted with intact suture material.  Orthopedic: Tenderness to palpation noted about the surgical site.   Culture 9/14 Specimen Description TISSUE RIGHT FOOT   Special Requests POF ANCEF   Gram Stain RARE WBC PRESENT, PREDOMINANTLY MONONUCLEAR  NO ORGANISMS SEEN      Culture NO GROWTH 5 DAYS   Report Status 09/10/2017 FINAL       Assessment & Plan:  S/p R Hallux IPJ Amputation -Healing well. Sutures left intact. -Dry sterile dressing applied. -Residual deep soft tissure culture reviewed as above. No need for continued abx therapy after patient finishes the current course of abx. Believed surgical cure of osteomyelitis.  Onychomycosis -Nails debrided per patient request.  Procedure: Nail Debridement Rationale: Patient meets criteria for routine foot care due to partial toe amputation Type of Debridement: manual, sharp debridement. Instrumentation: Nail nipper, rotary burr. Number of Nails: 9  Return in about 1 week (around 09/17/2017) for possible suture removal.

## 2017-09-12 ENCOUNTER — Encounter: Payer: BLUE CROSS/BLUE SHIELD | Admitting: Podiatry

## 2017-09-17 ENCOUNTER — Encounter: Payer: Self-pay | Admitting: Podiatry

## 2017-09-17 ENCOUNTER — Ambulatory Visit (INDEPENDENT_AMBULATORY_CARE_PROVIDER_SITE_OTHER): Payer: BLUE CROSS/BLUE SHIELD | Admitting: Podiatry

## 2017-09-17 DIAGNOSIS — Z9889 Other specified postprocedural states: Secondary | ICD-10-CM

## 2017-09-17 NOTE — Progress Notes (Signed)
   Subjective:    Patient ID: Crystal Dudley, female    DOB: 31-Jul-1971, 46 y.o.   MRN: [id number]  HPI Chief Complaint  Patient presents with  . Routine Post Op    i am doing good on my right big toe     46 y.o. female returns for the above complaint. Denies post-operative issues thus far. Has been ambulating with a CAM boot and crutch assist. No pain today. Endorses some numbness about the surgical site.  Review of Systems     Objective:   Physical Exam General AA&O x3. Normal mood and affect.  Vascular Foot warm and well perfused.  Neurologic Gross sensation intact. Slight hypoesthesia proximal to surgical site.  Dermatologic Skin healing well. Skin edges coapted.  Surgical site without signs of infection.  Orthopedic: Slight tenderness to palpation noted about the surgical site.     Assessment & Plan:  S/p R Hallux IPJ Amputation 09-05-17 -Sutures left intact today. Will review at next visit. -Will allow patient to WB in her CAM boot at this time. -No need for refill of pain medication today.  Return in about 1 week (around 09/24/2017) for suture removal.

## 2017-09-25 ENCOUNTER — Ambulatory Visit (INDEPENDENT_AMBULATORY_CARE_PROVIDER_SITE_OTHER): Payer: BLUE CROSS/BLUE SHIELD | Admitting: Podiatry

## 2017-09-25 DIAGNOSIS — Z9889 Other specified postprocedural states: Secondary | ICD-10-CM

## 2017-09-25 NOTE — Progress Notes (Signed)
   Subjective:    Patient ID: Crystal Dudley, female    DOB: 1971/11/28, 46 y.o.   MRN: [id number]  HPI Chief Complaint  Patient presents with  . Routine Post Op    right great toe/ suture removal    46 y.o. female returns for surgical follow-up. No pain, no complaints today.  Review of Systems     Objective:   Physical Exam General AA&O x3. Normal mood and affect.  Vascular Foot warm and well perfused.  Neurologic Gross sensation intact.  Dermatologic Skin well healed with intact suture. Surgical site without signs of infection.  Orthopedic: Slight tenderness to palpation noted about the surgical site.     Assessment & Plan:  S/p R Hallux IPJ Amputation 09-05-17 -Sutures removed. Steris applied. Advised it was ok to shower but not soak. -Transition to surgical shoe. Ok to d/c CAM boot.  Return in about 3 weeks (around 10/16/2017).

## 2017-10-01 NOTE — Progress Notes (Signed)
DOS 9.12.18 Amputation of Rt great toe- partial os complete

## 2017-10-03 LAB — FUNGAL ORGANISM REFLEX

## 2017-10-03 LAB — FUNGUS CULTURE WITH STAIN

## 2017-10-03 LAB — FUNGUS CULTURE RESULT

## 2017-10-16 ENCOUNTER — Ambulatory Visit (INDEPENDENT_AMBULATORY_CARE_PROVIDER_SITE_OTHER): Payer: BLUE CROSS/BLUE SHIELD

## 2017-10-16 ENCOUNTER — Ambulatory Visit (INDEPENDENT_AMBULATORY_CARE_PROVIDER_SITE_OTHER): Payer: BLUE CROSS/BLUE SHIELD | Admitting: Podiatry

## 2017-10-16 ENCOUNTER — Encounter: Payer: Self-pay | Admitting: Podiatry

## 2017-10-16 VITALS — BP 100/77 | Temp 97.1°F

## 2017-10-16 DIAGNOSIS — Z9889 Other specified postprocedural states: Secondary | ICD-10-CM

## 2017-10-16 DIAGNOSIS — M869 Osteomyelitis, unspecified: Secondary | ICD-10-CM

## 2017-10-16 NOTE — Progress Notes (Signed)
  Subjective:  Patient ID: Crystal Dudley, female    DOB: 05/16/1971,  MRN: [id number]  Chief Complaint  [facility] presents with  . [facility]    dos 09.12.2018 Amputation Toe Interphalangel 1st Rt   DOS: 09/03/17 Procedure: Amputation R Hallux IPJ  46 y.o. female returns for the above complaint. States there is a piece of suture material still present in her toe. Denies other issues   Objective:   Vitals:   10/16/17 0910  BP: 100/77  Temp: (!) 97.1 F (36.2 C)   General AA&O x3. Normal mood and affect.  Vascular Foot warm and well perfused.  Neurologic Gross sensation intact.  Dermatologic Skin Incision well-healed with small nylon suture visible   Orthopedic: No tenderness to palpation of the surgical site    Assessment & Plan:  Patient was evaluated and treated and all questions answered.  Status post right hallux partial amputation -Incision well-healed. Small suture removed from skin incision line  -DC surgical shoe okay to transition to normal shoes. -Will set up appointment for diabetic shoes  Return in about 2 months (around 12/16/2017) for amputation f/u, diabetic foot care.

## 2017-10-23 ENCOUNTER — Ambulatory Visit: Payer: BLUE CROSS/BLUE SHIELD | Admitting: Orthotics

## 2017-10-23 DIAGNOSIS — L84 Corns and callosities: Secondary | ICD-10-CM

## 2017-10-23 DIAGNOSIS — M792 Neuralgia and neuritis, unspecified: Secondary | ICD-10-CM

## 2017-10-23 DIAGNOSIS — Z89419 Acquired absence of unspecified great toe: Secondary | ICD-10-CM

## 2017-10-23 DIAGNOSIS — E119 Type 2 diabetes mellitus without complications: Secondary | ICD-10-CM

## 2017-11-01 DIAGNOSIS — Z89421 Acquired absence of other right toe(s): Secondary | ICD-10-CM | POA: Insufficient documentation

## 2017-11-20 ENCOUNTER — Ambulatory Visit (INDEPENDENT_AMBULATORY_CARE_PROVIDER_SITE_OTHER): Payer: BLUE CROSS/BLUE SHIELD | Admitting: Orthotics

## 2017-11-20 DIAGNOSIS — L84 Corns and callosities: Secondary | ICD-10-CM

## 2017-11-20 DIAGNOSIS — Z89419 Acquired absence of unspecified great toe: Secondary | ICD-10-CM

## 2017-11-20 DIAGNOSIS — E119 Type 2 diabetes mellitus without complications: Secondary | ICD-10-CM

## 2017-11-20 NOTE — Progress Notes (Addendum)
Patient presents today for diabetic shoe measurement and foam casting.  Goals of diabetic shoes/inserts to offer protection from conditions secondary to DM2, offer relief from sheer forces that could lead to ulcerations, protect the foot, and offer greater stability. Patient is under supervision of DPM Physician managing patients DM2: Patient has following documented conditions to qualify for diabetic shoes/inserts: Patient measured with brannock device: 

## 2017-11-20 NOTE — Progress Notes (Signed)

## 2017-12-18 ENCOUNTER — Ambulatory Visit: Payer: BLUE CROSS/BLUE SHIELD | Admitting: Podiatry

## 2017-12-19 ENCOUNTER — Ambulatory Visit (INDEPENDENT_AMBULATORY_CARE_PROVIDER_SITE_OTHER): Payer: BLUE CROSS/BLUE SHIELD | Admitting: Podiatry

## 2017-12-19 ENCOUNTER — Encounter: Payer: Self-pay | Admitting: Podiatry

## 2017-12-19 DIAGNOSIS — L84 Corns and callosities: Secondary | ICD-10-CM | POA: Diagnosis not present

## 2017-12-19 DIAGNOSIS — Z89619 Acquired absence of unspecified leg above knee: Secondary | ICD-10-CM

## 2017-12-19 DIAGNOSIS — B351 Tinea unguium: Secondary | ICD-10-CM | POA: Diagnosis not present

## 2017-12-19 DIAGNOSIS — E1169 Type 2 diabetes mellitus with other specified complication: Secondary | ICD-10-CM | POA: Diagnosis not present

## 2017-12-19 DIAGNOSIS — M2041 Other hammer toe(s) (acquired), right foot: Secondary | ICD-10-CM

## 2017-12-19 NOTE — Progress Notes (Signed)
  Subjective:  Patient ID: Crystal Dudley, female    DOB: 10/28/1971,  MRN: [id number]  Chief Complaint  Patient presents with  . Routine Post Op    dos 09.12.2018 Amputation Toe Interphalangel 1st Rt      46 y.o. female returns for diabetic foot care.  Underwent right partial hallux amputation 09/03/2017.  Doing well from this procedure.  Presents in diabetic shoes without issue.  Objective:  There were no vitals filed for this visit. General AA&O x3. Normal mood and affect.  Vascular Dorsalis pedis pulses present 2+ bilaterally  Posterior tibial pulses present 1+ bilaterally  Capillary refill normal to all digits. Pedal hair growth normal.  Neurologic Epicritic sensation present bilaterally. Protective sensation with 5.07 monofilament  absent bilaterally.  Dermatologic No open lesions. Interspaces clear of maceration.  Normal skin temperature and turgor. Hyperkeratotic lesions: Right second toe pre-ulcerative callus Nails: brittle, irregular, wavy nails, onychomycosis, thickening  Orthopedic: No history of amputation. MMT 5/5 in dorsiflexion, plantarflexion, inversion, and eversion. Normal lower extremity joint ROM without pain or crepitus. Right second toe hammertoe deformity with pre-ulcerative callus formation semi-reducible   Assessment & Plan:  Patient was evaluated and treated and all questions answered.  Diabetes with Amputation Hx, Onychomycosis -Educated on diabetic footcare. Diabetic risk level 3 -At risk foot care provided as below.  Procedure: Nail Debridement Rationale: Patient meets criteria for routine foot care due to amputation history Type of Debridement: manual, sharp debridement. Instrumentation: Nail nipper, rotary burr. Number of Nails: 9  Pre-ulcerative Callus R 2nd Toe with Hammertoe -Discussed with patient the benefits of performing/tenotomy to relieve the contracture and prevent further callus formation which could lead to ulceration.  All risk  benefits alternatives explained and consent signed by patient. -Flexor tenotomy performed as below  Procedure: Flexor Tenotomy Indication for Procedure: toe with semi-reducible hammertoe with distal tip ulceration. Flexor tenotomy indicated to alleviate contracture, reduce pressure, and enhance healing of the ulceration. Location: right, 2nd toe Anesthesia: Lidocaine 1% plain; 1.1065mL and Marcaine 0.5% plain; 1.565mL digital block Instrumentation: 15 blade, 4-0 nylon. Technique: The toe was anesthetized as above and prepped in the usual fashion. The toe was exanquinated and a tourniquet was secured at the base of the toe. A 15 blade was then used to make a transverse incision over the plantar aspect of the distal interphalangeal joint. The flexor tendon was incised with noted release of the hammertoe deformity. The incision was then closed with 3-0 nylon and dressed with sterile dressing. Patient tolerated the procedure well. Dressing: Dry, sterile, compression dressing. Disposition: Patient tolerated procedure well. Patient to return in 1 week for follow-up.   Return in about 2 weeks (around 01/02/2018) for Post-op tenotomy.

## 2018-01-02 ENCOUNTER — Ambulatory Visit (INDEPENDENT_AMBULATORY_CARE_PROVIDER_SITE_OTHER): Payer: BLUE CROSS/BLUE SHIELD | Admitting: Podiatry

## 2018-01-02 DIAGNOSIS — E1142 Type 2 diabetes mellitus with diabetic polyneuropathy: Secondary | ICD-10-CM

## 2018-01-02 DIAGNOSIS — L84 Corns and callosities: Secondary | ICD-10-CM | POA: Diagnosis not present

## 2018-01-02 DIAGNOSIS — Z89419 Acquired absence of unspecified great toe: Secondary | ICD-10-CM

## 2018-01-02 NOTE — Progress Notes (Signed)
  Subjective:  Patient ID: Crystal Dudley, female    DOB: 01/13/1971,  MRN: [id number]  Chief Complaint  Patient presents with  . Routine Post Op    **NO COPAY  PER DR. PRICE**2 WK FUP (OUT OF POST OP)dos 09.12.2018 Amputation Toe Interphalangel 1st Rt   47 y.o. female returns for the above complaint.  Denies issues today.  Had some pain in the right second toe over the weekend.  Objective:  There were no vitals filed for this visit. General AA&O x3. Normal mood and affect.  Vascular Pedal pulses palpable.  Neurologic Epicritic sensation grossly intact.  Dermatologic No open lesions. Skin normal texture and turgor.  Orthopedic: No pain to palpation either foot. History of amputation right hallux IPJ.  Right second toe remains straight with distal hyperkeratosis   Assessment & Plan:  Patient was evaluated and treated and all questions answered.  Status post flexor tenotomy right second toe -To remain straight.  Pre-ulcerative callus at the tip of the toe remains and gently debrided today. -Continue diabetic shoes -Follow-up in 4 weeks for recheck  Return in about 4 weeks (around 01/30/2018).

## 2018-01-30 ENCOUNTER — Ambulatory Visit: Payer: BLUE CROSS/BLUE SHIELD | Admitting: Podiatry

## 2018-02-04 ENCOUNTER — Ambulatory Visit (INDEPENDENT_AMBULATORY_CARE_PROVIDER_SITE_OTHER): Payer: BLUE CROSS/BLUE SHIELD | Admitting: Podiatry

## 2018-02-04 DIAGNOSIS — E1142 Type 2 diabetes mellitus with diabetic polyneuropathy: Secondary | ICD-10-CM

## 2018-02-04 DIAGNOSIS — Z89419 Acquired absence of unspecified great toe: Secondary | ICD-10-CM | POA: Diagnosis not present

## 2018-02-04 DIAGNOSIS — B351 Tinea unguium: Secondary | ICD-10-CM | POA: Diagnosis not present

## 2018-02-04 DIAGNOSIS — L84 Corns and callosities: Secondary | ICD-10-CM

## 2018-02-05 NOTE — Progress Notes (Signed)
  Subjective:  Patient ID: Crystal Dudley, female    DOB: 07/05/1971,  MRN: [id number]  No chief complaint on file.  47 y.o. female returns for the above complaint.  Doing well status post tenotomy.  Denies pain in the second toe.  Complains of thickened nails and callus at the tip of the second toe.  Objective:  There were no vitals filed for this visit. General AA&O x3. Normal mood and affect.  Vascular Pedal pulses palpable.  Neurologic Epicritic sensation grossly intact.  Dermatologic No open lesions. Skin normal texture and turgor.  Right second toe pre-ulcerative callus distal tip.  Nails x9 elongated and thickened with subungual debris  Orthopedic: No pain to palpation either foot. Partial hallux amputation right   Assessment & Plan:  Patient was evaluated and treated and all questions answered.  Status post left second toe flexor tenotomy -Stab incision healed, still some callus formation at the distal tip.  Debrided today with 312 blade without incident.  Procedure: Paring of Lesion Rationale: painful hyperkeratotic lesion Type of Debridement: manual, sharp debridement. Instrumentation: 312 blade Number of Lesions: 1   Onychomycosis with amputation history -Nails debrided x9  Procedure: Nail Debridement Rationale: Patient meets criteria for routine foot care due to class a findings Type of Debridement: manual, sharp debridement. Instrumentation: Nail nipper, rotary burr. Number of Nails: 9   Return in about 3 months (around 05/04/2018) for Diabetic Foot Care.

## 2018-05-07 ENCOUNTER — Ambulatory Visit (INDEPENDENT_AMBULATORY_CARE_PROVIDER_SITE_OTHER): Payer: BLUE CROSS/BLUE SHIELD | Admitting: Podiatry

## 2018-05-07 ENCOUNTER — Encounter: Payer: Self-pay | Admitting: Podiatry

## 2018-05-07 DIAGNOSIS — E1142 Type 2 diabetes mellitus with diabetic polyneuropathy: Secondary | ICD-10-CM | POA: Diagnosis not present

## 2018-05-07 DIAGNOSIS — L84 Corns and callosities: Secondary | ICD-10-CM

## 2018-05-07 DIAGNOSIS — Z89419 Acquired absence of unspecified great toe: Secondary | ICD-10-CM

## 2018-05-07 DIAGNOSIS — B351 Tinea unguium: Secondary | ICD-10-CM | POA: Diagnosis not present

## 2018-05-08 NOTE — Progress Notes (Addendum)
  Subjective:  Patient ID: Crystal Dudley, female    DOB: 05/15/1971,  MRN: [id number]  Chief Complaint  Patient presents with  . Debridement    Trim toenails  . Callouses    Trim callus   47 y.o. female returns for the above complaint. Reports worsening callus to the right foot.  Objective:  There were no vitals filed for this visit. General AA&O x3. Normal mood and affect.  Vascular Pedal pulses palpable.  Neurologic Epicritic sensation grossly intact.  Dermatologic No open lesions. Skin normal texture and turgor.  Right second toe pre-ulcerative callus distal tip.  Nails x9 elongated and thickened with subungual debris  Orthopedic: No pain to palpation either foot. Partial hallux amputation right   Assessment & Plan:  Patient was evaluated and treated and all questions answered.  Status post left second toe flexor tenotomy -Stab incision healed, still some callus formation at the distal tip.  Debrided today with 312 blade without incident.  Procedure: Paring of Lesion Rationale: painful hyperkeratotic lesion Type of Debridement: manual, sharp debridement. Instrumentation: 312 blade Number of Lesions: 1   Onychomycosis with amputation history -Nails debrided x9 -Seen by Ria Clock for insert modification R  Procedure: Nail Debridement Rationale: Patient meets criteria for routine foot care due to class a findings Type of Debridement: manual, sharp debridement. Instrumentation: Nail nipper, rotary burr. Number of Nails: 9   Return in about 2 months (around 07/09/2018) for Diabetic Foot Care.

## 2018-07-09 ENCOUNTER — Ambulatory Visit: Payer: BLUE CROSS/BLUE SHIELD | Admitting: Podiatry

## 2018-07-16 ENCOUNTER — Ambulatory Visit: Payer: BLUE CROSS/BLUE SHIELD | Admitting: Podiatry

## 2018-07-30 ENCOUNTER — Ambulatory Visit (INDEPENDENT_AMBULATORY_CARE_PROVIDER_SITE_OTHER): Payer: BLUE CROSS/BLUE SHIELD | Admitting: Podiatry

## 2018-07-30 ENCOUNTER — Other Ambulatory Visit: Payer: Self-pay

## 2018-07-30 DIAGNOSIS — E1142 Type 2 diabetes mellitus with diabetic polyneuropathy: Secondary | ICD-10-CM | POA: Diagnosis not present

## 2018-07-30 DIAGNOSIS — Z89419 Acquired absence of unspecified great toe: Secondary | ICD-10-CM

## 2018-07-30 DIAGNOSIS — B351 Tinea unguium: Secondary | ICD-10-CM | POA: Diagnosis not present

## 2018-07-30 DIAGNOSIS — L84 Corns and callosities: Secondary | ICD-10-CM

## 2018-08-09 IMAGING — CR DG FOOT COMPLETE 3+V*R*
3 series · 3 of 3 positions shown · non-contrast
Comparison: 08/28/2017

CLINICAL DATA: Great toe partial amputation

EXAM:
RIGHT FOOT COMPLETE - 3+ VIEW

[AP]
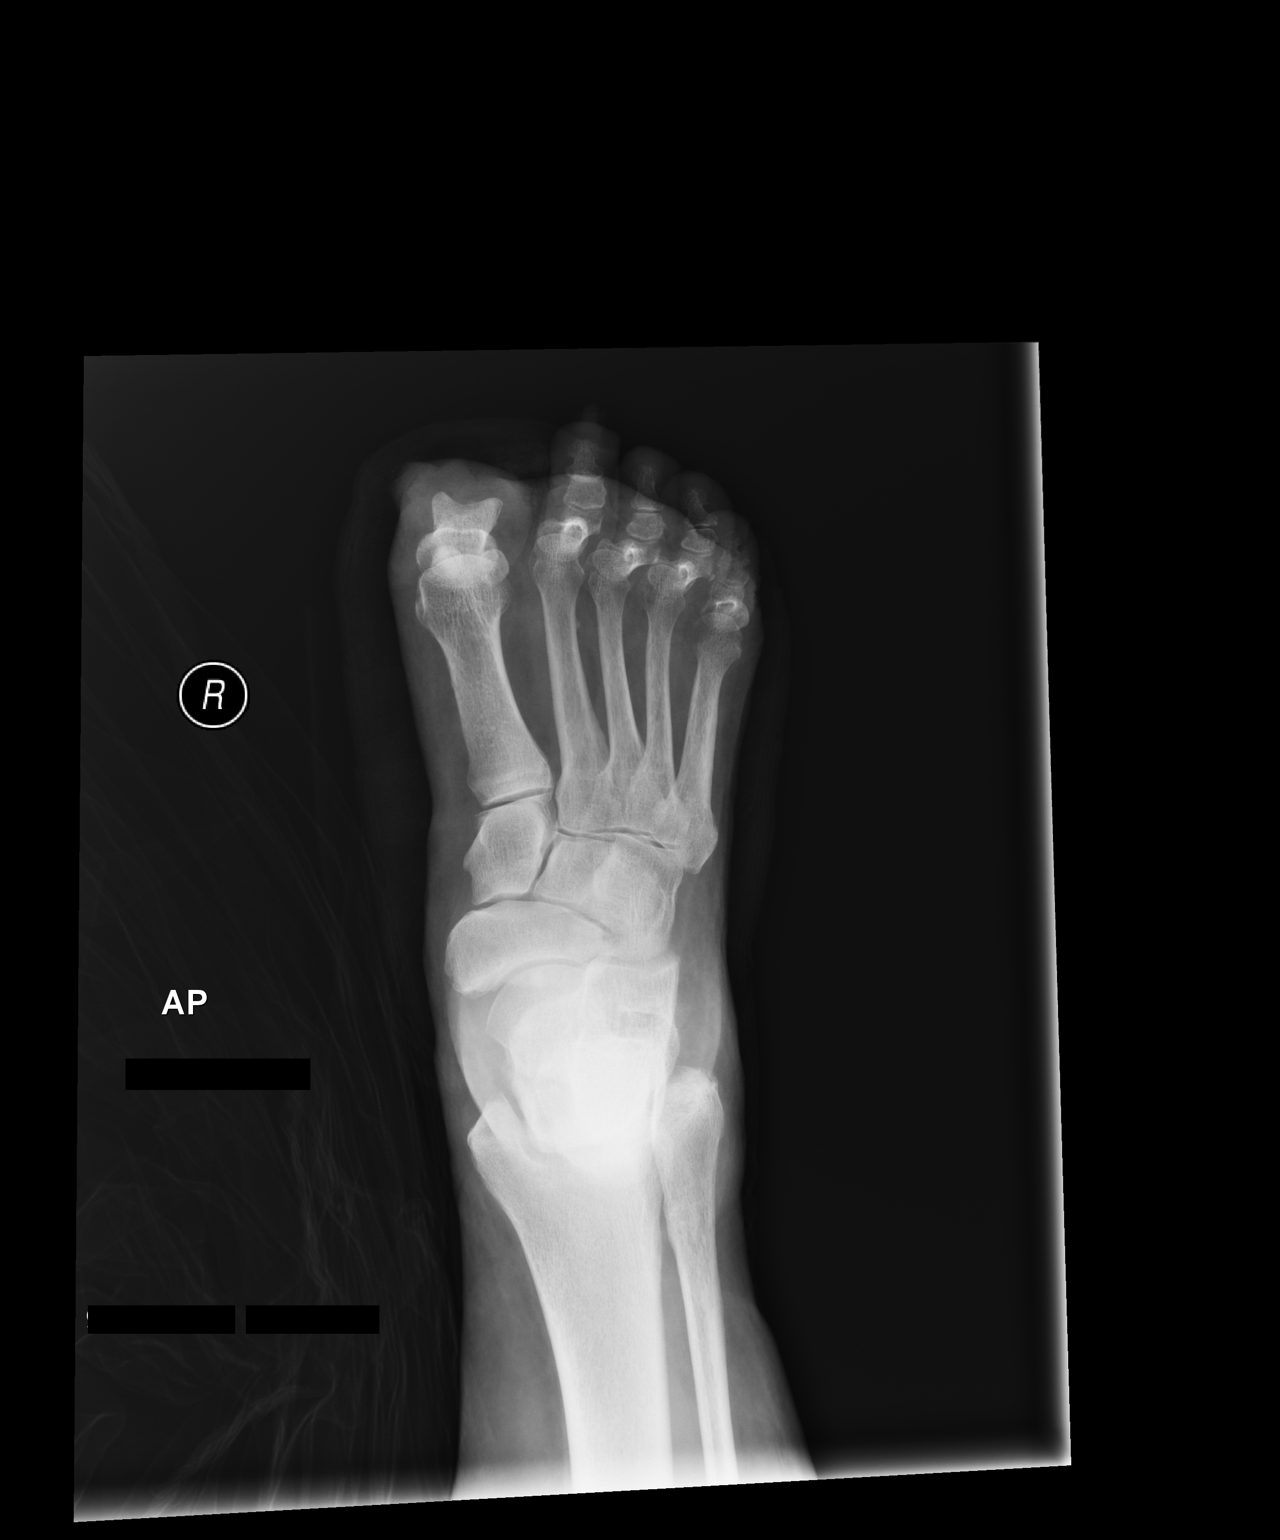

[ap obl int rot]
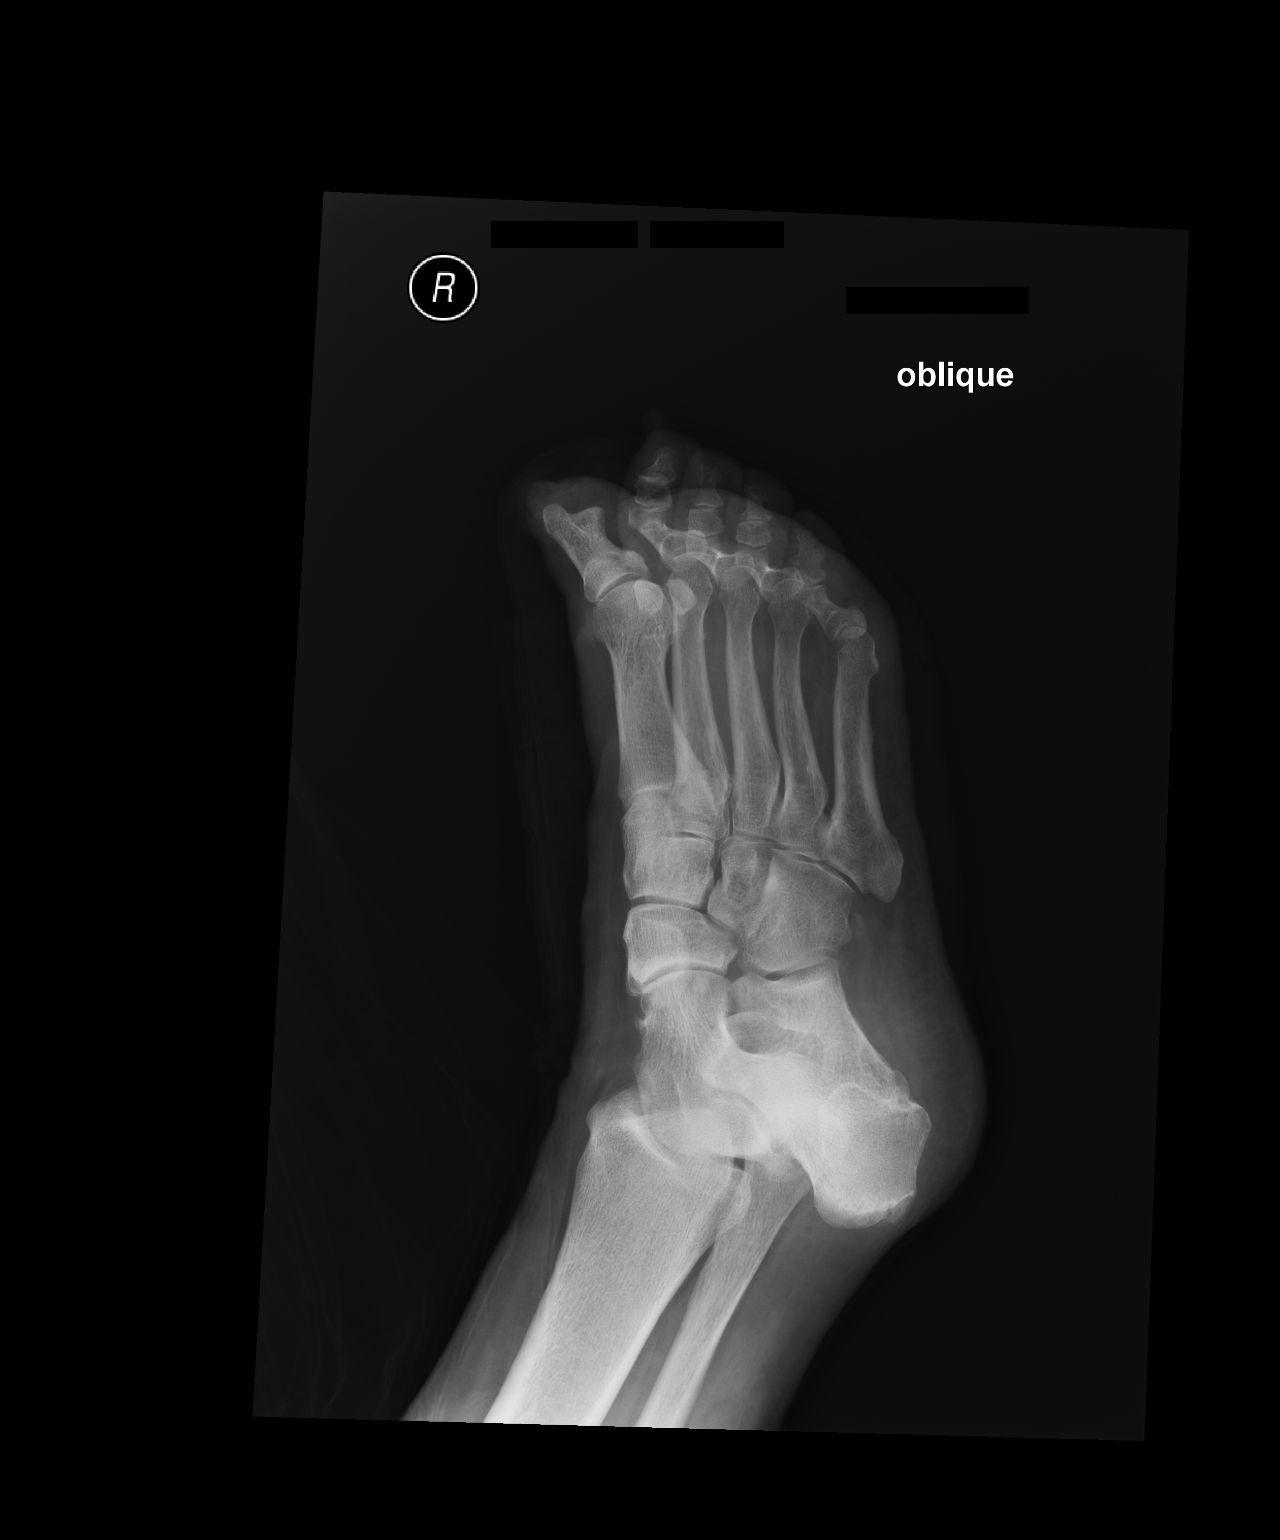

[lateral]
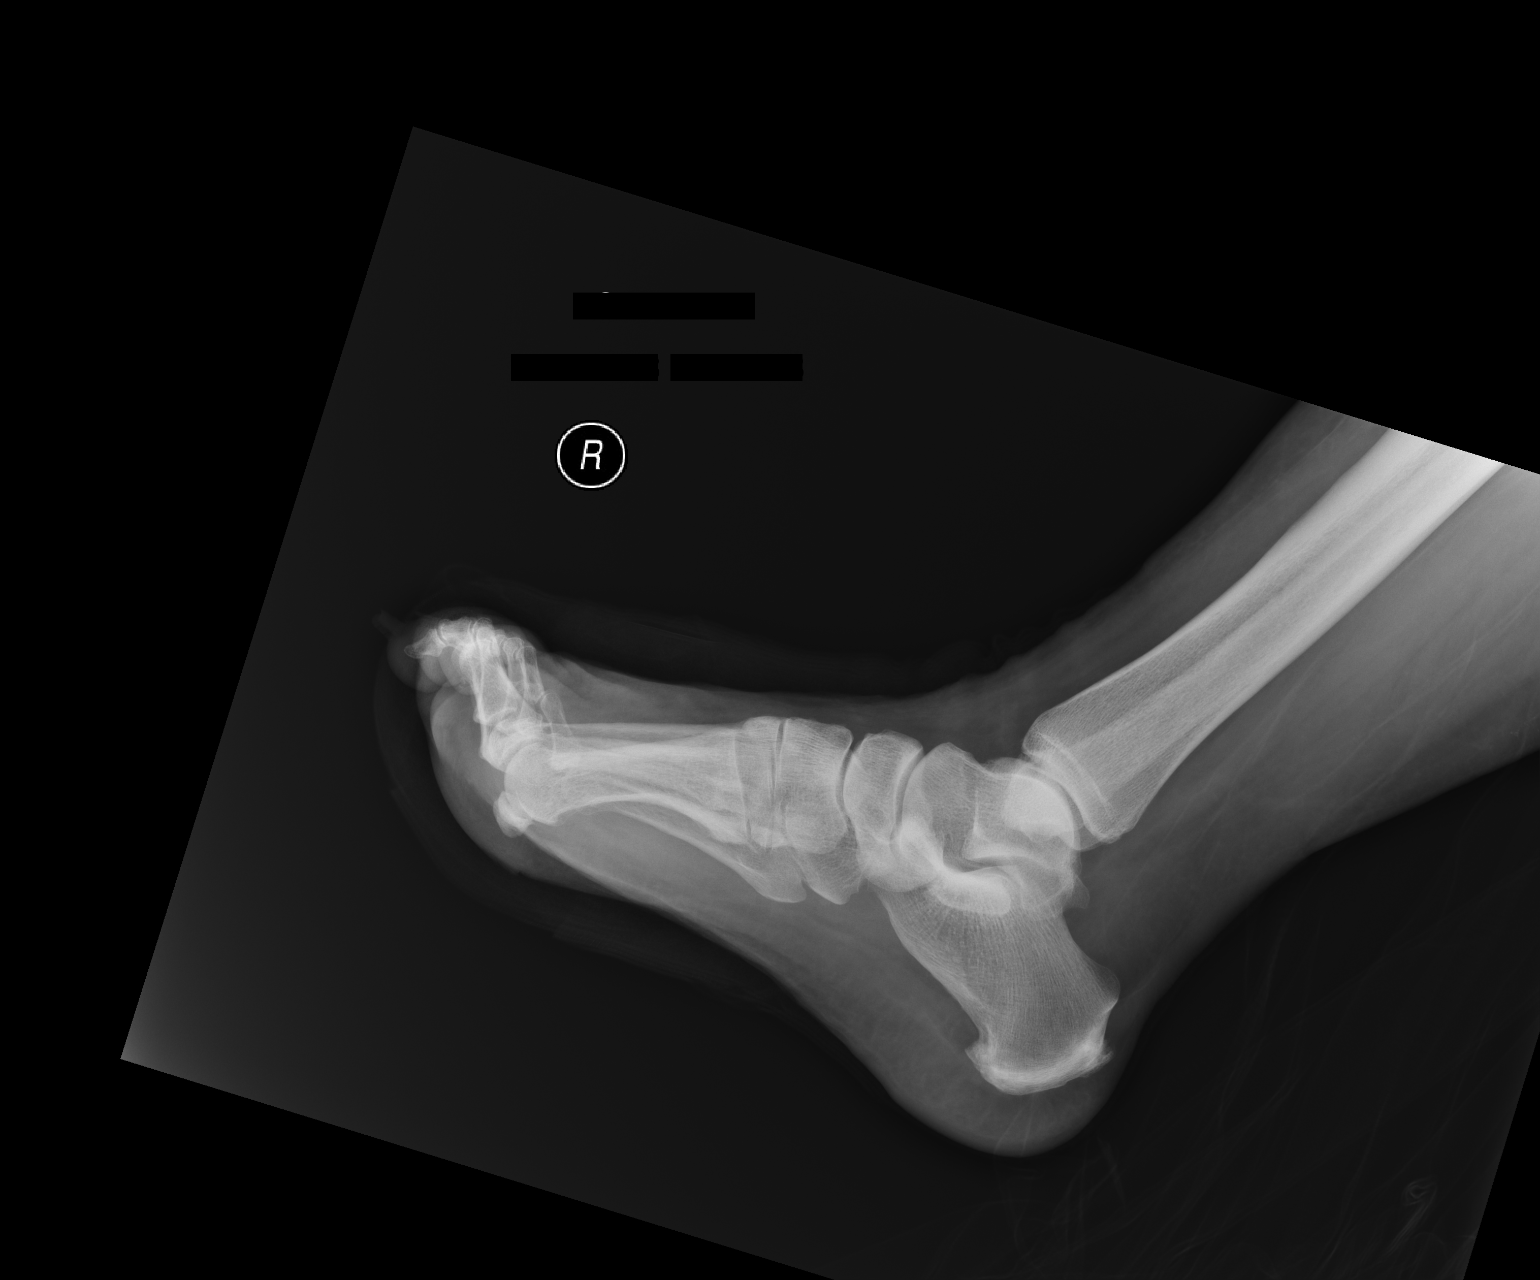

[3 of 3 positions shown; findings below may reference images not displayed]

FINDINGS: Patient is status post partial amputation of the right great toe
distally. Expected postoperative appearance. Other toes are flexed
in position. No radiopaque foreign body. No other complicating
feature.
IMPRESSION: Expected appearance status post right great toe partial amputation
removing the distal phalanx.

## 2018-08-11 ENCOUNTER — Ambulatory Visit: Payer: BLUE CROSS/BLUE SHIELD | Admitting: Orthotics

## 2018-08-11 DIAGNOSIS — L84 Corns and callosities: Secondary | ICD-10-CM

## 2018-08-11 DIAGNOSIS — E1142 Type 2 diabetes mellitus with diabetic polyneuropathy: Secondary | ICD-10-CM

## 2018-08-11 DIAGNOSIS — E119 Type 2 diabetes mellitus without complications: Secondary | ICD-10-CM

## 2018-08-11 NOTE — Progress Notes (Signed)
Patient presents today for diabetic shoe measurement and foam casting.  Goals of diabetic shoes/inserts to offer protection from conditions secondary to DM2, offer relief from sheer forces that could lead to ulcerations, protect the foot, and offer greater stability. Patient is under supervision of DPM Physician managing patients DM2: Patient has following documented conditions to qualify for diabetic shoes/inserts: Patient measured with brannock device: 

## 2018-08-22 NOTE — Progress Notes (Signed)
  Subjective:  Patient ID: Crystal Dudley, female    DOB: [birth year],  MRN: [id number]  Chief Complaint  Patient presents with  . Nail Problem    2 month trim     47 y.o. female returns for the above complaint. Reports worsening callus to the right foot.  Objective:  There were no vitals filed for this visit. General AA&O x3. Normal mood and affect.  Vascular Pedal pulses palpable.  Neurologic Epicritic sensation grossly intact.  Dermatologic No open lesions. Skin normal texture and turgor. Nails x9 elongated and thickened with subungual debris.  Hyperkeratotic lesion sub-met 1 3 right  Orthopedic: No pain to palpation either foot. Partial hallux amputation right   Assessment & Plan:  Patient was evaluated and treated and all questions answered.  DM with Onychomycosis with amputation history -Nails debrided x9  Procedure: Nail Debridement Rationale: Patient meets criteria for routine foot care due to amputation history Type of Debridement: manual, sharp debridement. Instrumentation: Nail nipper, rotary burr. Number of Nails: 9  Procedure: Paring of Lesion Rationale: painful hyperkeratotic lesion Type of Debridement: manual, sharp debridement. Instrumentation: 312 blade Number of Lesions: 2   Patient to see orthotist for new DM insert    Return in about 9 weeks (around 10/01/2018) for Diabetic Foot Care.

## 2018-08-31 ENCOUNTER — Other Ambulatory Visit (HOSPITAL_COMMUNITY)
Admission: RE | Admit: 2018-08-31 | Discharge: 2018-08-31 | Disposition: A | Discharge status: Home / Self Care | Payer: BLUE CROSS/BLUE SHIELD | Source: Ambulatory Visit | Attending: Family Medicine | Admitting: Family Medicine

## 2018-08-31 ENCOUNTER — Other Ambulatory Visit: Payer: Self-pay | Admitting: Family Medicine

## 2018-08-31 DIAGNOSIS — Z1231 Encounter for screening mammogram for malignant neoplasm of breast: Secondary | ICD-10-CM

## 2018-08-31 DIAGNOSIS — Z124 Encounter for screening for malignant neoplasm of cervix: Secondary | ICD-10-CM | POA: Diagnosis present

## 2018-09-02 LAB — CYTOLOGY - PAP: Diagnosis: NEGATIVE

## 2018-09-28 ENCOUNTER — Ambulatory Visit (INDEPENDENT_AMBULATORY_CARE_PROVIDER_SITE_OTHER): Payer: BLUE CROSS/BLUE SHIELD | Admitting: Orthotics

## 2018-09-28 DIAGNOSIS — E119 Type 2 diabetes mellitus without complications: Secondary | ICD-10-CM

## 2018-09-28 DIAGNOSIS — E1142 Type 2 diabetes mellitus with diabetic polyneuropathy: Secondary | ICD-10-CM

## 2018-09-28 DIAGNOSIS — L84 Corns and callosities: Secondary | ICD-10-CM | POA: Diagnosis not present

## 2018-09-28 DIAGNOSIS — Z89419 Acquired absence of unspecified great toe: Secondary | ICD-10-CM

## 2018-09-28 DIAGNOSIS — M2041 Other hammer toe(s) (acquired), right foot: Secondary | ICD-10-CM

## 2018-09-28 NOTE — Progress Notes (Signed)
Patient came in today to pick up diabetic shoes and custom inserts.  [id number] R.   Same was well pleased with fit and function.   The patient could ambulate without any discomfort; there were no signs of any quality issues. The foot ortheses offered full contact with plantar surface and contoured the arch well.   The shoes fit well with no heel slippage and areas of pressure concern.   Patient advised to contact us if any problems arise.  Patient also advised on how to report any issues.

## 2018-09-30 ENCOUNTER — Ambulatory Visit
Admission: RE | Admit: 2018-09-30 | Discharge: 2018-09-30 | Disposition: A | Discharge status: Home / Self Care | Payer: BLUE CROSS/BLUE SHIELD | Source: Ambulatory Visit | Attending: Family Medicine | Admitting: Family Medicine

## 2018-09-30 DIAGNOSIS — Z1231 Encounter for screening mammogram for malignant neoplasm of breast: Secondary | ICD-10-CM

## 2018-10-02 ENCOUNTER — Encounter: Payer: Self-pay | Admitting: Podiatry

## 2018-10-02 ENCOUNTER — Ambulatory Visit (INDEPENDENT_AMBULATORY_CARE_PROVIDER_SITE_OTHER): Payer: BLUE CROSS/BLUE SHIELD | Admitting: Podiatry

## 2018-10-02 DIAGNOSIS — M79674 Pain in right toe(s): Secondary | ICD-10-CM | POA: Diagnosis not present

## 2018-10-02 DIAGNOSIS — L84 Corns and callosities: Secondary | ICD-10-CM | POA: Diagnosis not present

## 2018-10-02 DIAGNOSIS — B351 Tinea unguium: Secondary | ICD-10-CM

## 2018-10-02 DIAGNOSIS — M79675 Pain in left toe(s): Secondary | ICD-10-CM

## 2018-10-02 DIAGNOSIS — E1142 Type 2 diabetes mellitus with diabetic polyneuropathy: Secondary | ICD-10-CM

## 2018-10-03 NOTE — Progress Notes (Signed)
Subjective: Crystal Dudley  Is a pleasant 47 y.o. WF who to clinic for preventative diabetic foot care. She has h/o partial amputation right hallux.   Today, she presents with diabetes, diabetic neuropathy and cc of painful, discolored, thick toenails both feet.  She also has calluses which require periodic debridement to in order to prevent future ulcerations.  She states her blood sugar was 98 mg/dL this morning  She is on gabapentin for neuropathy.  PCP: Cam Hai Endocrinologist: Vale Haven Altheimer, MD; last DOS 08/11/2018  Objective: Vascular Examination: Capillary refill time immediate x 9 digits and distal stump remaining hallux right foot Dorsalis pedis and Posterior tibial pulses present b/l No digital hair x 10 digits Skin temperature warm to cool b/l  Dermatological Examination: Skin with normal turgor, texture and tone b/l Toenails 1-5 b/l discolored, thick, dystrophic with subungual debris and pain with palpation to nailbeds due to thickness of nails. Hyperkeratotic lesion submetatarsal head 1, 3 right foot No open wounds noted on today. Right hallux stump with incision well coapted. No signs of friction/skin irritation. No edema, no erythema, no warmth, no flocculence.  Musculoskeletal: Muscle strength 5/5 to all LE muscle groups  Neurological: Sensation intact with 10 gram monofilament  Assessment: 1. Painful onychomycosis toenails 1-5 b/l 2. Callus submet heads 1, 3 right foot 3. NIDDM with Diabetic neuropathy  Plan: 1. Continue diabetic foot care principles.  2. Toenails 1-5 b/l were debrided in length and girth without iatrogenic bleeding. 3. Hyperkeratotic lesion pared with sterile chisel blade right foot submet heads 1, 3  4. Patient to continue diabetic shoe gear daily 5. Patient to report any pedal injuries to medical professional  6. Follow up 3 months. Patient/POA to call should there be a concern in the interim.

## 2019-01-01 ENCOUNTER — Ambulatory Visit (INDEPENDENT_AMBULATORY_CARE_PROVIDER_SITE_OTHER): Payer: BLUE CROSS/BLUE SHIELD | Admitting: Podiatry

## 2019-01-01 DIAGNOSIS — B351 Tinea unguium: Secondary | ICD-10-CM | POA: Diagnosis not present

## 2019-01-01 DIAGNOSIS — M79675 Pain in left toe(s): Secondary | ICD-10-CM | POA: Diagnosis not present

## 2019-01-01 DIAGNOSIS — L84 Corns and callosities: Secondary | ICD-10-CM

## 2019-01-01 DIAGNOSIS — E1142 Type 2 diabetes mellitus with diabetic polyneuropathy: Secondary | ICD-10-CM | POA: Diagnosis not present

## 2019-01-01 DIAGNOSIS — M79674 Pain in right toe(s): Secondary | ICD-10-CM

## 2019-01-01 NOTE — Patient Instructions (Signed)
Diabetes Mellitus and Foot Care Foot care is an important part of your health, especially when you have diabetes. Diabetes may cause you to have problems because of poor blood flow (circulation) to your feet and legs, which can cause your skin to:  Become thinner and drier.  Break more easily.  Heal more slowly.  Peel and crack. You may also have nerve damage (neuropathy) in your legs and feet, causing decreased feeling in them. This means that you may not notice minor injuries to your feet that could lead to more serious problems. Noticing and addressing any potential problems early is the best way to prevent future foot problems. How to care for your feet Foot hygiene  Wash your feet daily with warm water and mild soap. Do not use hot water. Then, pat your feet and the areas between your toes until they are completely dry. Do not soak your feet as this can dry your skin.  Trim your toenails straight across. Do not dig under them or around the cuticle. File the edges of your nails with an emery board or nail file.  Apply a moisturizing lotion or petroleum jelly to the skin on your feet and to dry, brittle toenails. Use lotion that does not contain alcohol and is unscented. Do not apply lotion between your toes. Shoes and socks  Wear clean socks or stockings every day. Make sure they are not too tight. Do not wear knee-high stockings since they may decrease blood flow to your legs.  Wear shoes that fit properly and have enough cushioning. Always look in your shoes before you put them on to be sure there are no objects inside.  To break in new shoes, wear them for just a few hours a day. This prevents injuries on your feet. Wounds, scrapes, corns, and calluses  Check your feet daily for blisters, cuts, bruises, sores, and redness. If you cannot see the bottom of your feet, use a mirror or ask someone for help.  Do not cut corns or calluses or try to remove them with medicine.  If you  find a minor scrape, cut, or break in the skin on your feet, keep it and the skin around it clean and dry. You may clean these areas with mild soap and water. Do not clean the area with peroxide, alcohol, or iodine.  If you have a wound, scrape, corn, or callus on your foot, look at it several times a day to make sure it is healing and not infected. Check for: ? Redness, swelling, or pain. ? Fluid or blood. ? Warmth. ? Pus or a bad smell. General instructions  Do not cross your legs. This may decrease blood flow to your feet.  Do not use heating pads or hot water bottles on your feet. They may burn your skin. If you have lost feeling in your feet or legs, you may not know this is happening until it is too late.  Protect your feet from hot and cold by wearing shoes, such as at the beach or on hot pavement.  Schedule a complete foot exam at least once a year (annually) or more often if you have foot problems. If you have foot problems, report any cuts, sores, or bruises to your health care provider immediately. Contact a health care provider if:  You have a medical condition that increases your risk of infection and you have any cuts, sores, or bruises on your feet.  You have an injury that is not   healing.  You have redness on your legs or feet.  You feel burning or tingling in your legs or feet.  You have pain or cramps in your legs and feet.  Your legs or feet are numb.  Your feet always feel cold.  You have pain around a toenail. Get help right away if:  You have a wound, scrape, corn, or callus on your foot and: ? You have pain, swelling, or redness that gets worse. ? You have fluid or blood coming from the wound, scrape, corn, or callus. ? Your wound, scrape, corn, or callus feels warm to the touch. ? You have pus or a bad smell coming from the wound, scrape, corn, or callus. ? You have a fever. ? You have a red line going up your leg. Summary  Check your feet every day  for cuts, sores, red spots, swelling, and blisters.  Moisturize feet and legs daily.  Wear shoes that fit properly and have enough cushioning.  If you have foot problems, report any cuts, sores, or bruises to your health care provider immediately.  Schedule a complete foot exam at least once a year (annually) or more often if you have foot problems. This information is not intended to replace advice given to you by your health care provider. Make sure you discuss any questions you have with your health care provider. Document Released: 12/06/2000 Document Revised: 01/21/2018 Document Reviewed: 01/10/2017 Elsevier Interactive Patient Education  2019 [employer] Onychomycosis/Fungal Toenails  WHAT IS IT? An infection that lies within the keratin of your nail plate that is caused by a fungus.  WHY ME? Fungal infections affect all ages, sexes, races, and creeds.  There may be many factors that predispose you to a fungal infection such as age, coexisting medical conditions such as diabetes, or an autoimmune disease; stress, medications, fatigue, genetics, etc.  Bottom line: fungus thrives in a warm, moist environment and your shoes offer such a location.  IS IT CONTAGIOUS? Theoretically, yes.  You do not want to share shoes, nail clippers or files with someone who has fungal toenails.  Walking around barefoot in the same room or sleeping in the same bed is unlikely to transfer the organism.  It is important to realize, however, that fungus can spread easily from one nail to the next on the same foot.  HOW DO WE TREAT THIS?  There are several ways to treat this condition.  Treatment may depend on many factors such as age, medications, pregnancy, liver and kidney conditions, etc.  It is best to ask your doctor which options are available to you.  1. No treatment.   Unlike many other medical concerns, you can live with this condition.  However for many people this can be a painful condition and may  lead to ingrown toenails or a bacterial infection.  It is recommended that you keep the nails cut short to help reduce the amount of fungal nail. 2. Topical treatment.  These range from herbal remedies to prescription strength nail lacquers.  About 40-50% effective, topicals require twice daily application for approximately 9 to 12 months or until an entirely new nail has grown out.  The most effective topicals are medical grade medications available through physicians offices. 3. Oral antifungal medications.  With an 80-90% cure rate, the most common oral medication requires 3 to 4 months of therapy and stays in your system for a year as the new nail grows out.  Oral antifungal medications do require blood   work to make sure it is a safe drug for you.  A liver function panel will be performed prior to starting the medication and after the first month of treatment.  It is important to have the blood work performed to avoid any harmful side effects.  In general, this medication safe but blood work is required. 4. Laser Therapy.  This treatment is performed by applying a specialized laser to the affected nail plate.  This therapy is noninvasive, fast, and non-painful.  It is not covered by insurance and is therefore, out of pocket.  The results have been very good with a 80-95% cure rate.  [facility] is the only practice in the area to offer this therapy. 5. Permanent Nail Avulsion.  Removing the entire nail so that a new nail will not grow back.  Corns and Calluses Corns are small areas of thickened skin that occur on the top, sides, or tip of a toe. They contain a cone-shaped core with a point that can press on a nerve below. This causes pain.  Calluses are areas of thickened skin that can occur anywhere on the body, including the hands, fingers, palms, soles of the feet, and heels. Calluses are usually larger than corns. What are the causes? Corns and calluses are caused by rubbing (friction) or  pressure, such as from shoes that are too tight or do not fit properly. What increases the risk? Corns are more likely to develop in people who have misshapen toes (toe deformities), such as hammer toes. Calluses can occur with friction to any area of the skin. They are more likely to develop in people who:  Work with their hands.  Wear shoes that fit poorly, are too tight, or are high-heeled.  Have toe deformities. What are the signs or symptoms? Symptoms of a corn or callus include:  A hard growth on the skin.  Pain or tenderness under the skin.  Redness and swelling.  Increased discomfort while wearing tight-fitting shoes, if your feet are affected. If a corn or callus becomes infected, symptoms may include:  Redness and swelling that gets worse.  Pain.  Fluid, blood, or pus draining from the corn or callus. How is this diagnosed? Corns and calluses may be diagnosed based on your symptoms, your medical history, and a physical exam. How is this treated? Treatment for corns and calluses may include:  Removing the cause of the friction or pressure. This may involve: ? Changing your shoes. ? Wearing shoe inserts (orthotics) or other protective layers in your shoes, such as a corn pad. ? Wearing gloves.  Applying medicine to the skin (topical medicine) to help soften skin in the hardened, thickened areas.  Removing layers of dead skin with a file to reduce the size of the corn or callus.  Removing the corn or callus with a scalpel or laser.  Taking antibiotic medicines, if your corn or callus is infected.  Having surgery, if a toe deformity is the cause. Follow these instructions at home:   Take over-the-counter and prescription medicines only as told by your health care provider.  If you were prescribed an antibiotic, take it as told by your health care provider. Do not stop taking it even if your condition starts to improve.  Wear shoes that fit well. Avoid  wearing high-heeled shoes and shoes that are too tight or too loose.  Wear any padding, protective layers, gloves, or orthotics as told by your health care provider.  Soak your   hands or feet and then use a file or pumice stone to soften your corn or callus. Do this as told by your health care provider.  Check your corn or callus every day for symptoms of infection. Contact a health care provider if you:  Notice that your symptoms do not improve with treatment.  Have redness or swelling that gets worse.  Notice that your corn or callus becomes painful.  Have fluid, blood, or pus coming from your corn or callus.  Have new symptoms. Summary  Corns are small areas of thickened skin that occur on the top, sides, or tip of a toe.  Calluses are areas of thickened skin that can occur anywhere on the body, including the hands, fingers, palms, and soles of the feet. Calluses are usually larger than corns.  Corns and calluses are caused by rubbing (friction) or pressure, such as from shoes that are too tight or do not fit properly.  Treatment may include wearing any padding, protective layers, gloves, or orthotics as told by your health care provider. This information is not intended to replace advice given to you by your health care provider. Make sure you discuss any questions you have with your health care provider. Document Released: 09/14/2004 Document Revised: 10/22/2017 Document Reviewed: 10/22/2017 Elsevier Interactive Patient Education  2019 [employer]  

## 2019-01-23 ENCOUNTER — Encounter: Payer: Self-pay | Admitting: Podiatry

## 2019-01-23 NOTE — Progress Notes (Signed)
Subjective: Crystal Dudley presents with diabetes, diabetic neuropathy and cc of painful, discolored, thick toenails and painful calluses right footwhich interfere with activities of daily living. Pain is aggravated when wearing enclosed shoe gear. Pain is relieved with periodic professional debridement.  [name], MD is her PCP.  Her last A1c was 6.6.   Current Outpatient Medications:  .  aspirin 81 MG tablet, Take 81 mg by mouth daily., Disp: , Rfl:  .  Cholecalciferol (VITAMIN D3) 5000 units CAPS, Take 5,000 Units by mouth daily., Disp: , Rfl:  .  Cranberry-Vitamin C (AZO CRANBERRY URINARY TRACT PO), Take 1 tablet by mouth daily., Disp: , Rfl:  .  gabapentin (NEURONTIN) 300 MG capsule, Take 1 capsule (300 mg total) by mouth 3 (three) times daily. Start with nightly only. Can slowly increase to three times a day as directed., Disp: 30 capsule, Rfl: 3 .  hydrochlorothiazide (HYDRODIURIL) 25 MG tablet, Take 25 mg by mouth daily., Disp: , Rfl:  .  INVOKAMET XR [phone] MG TB24, Take 2 tablets by mouth daily. , Disp: , Rfl:  .  lisinopril (PRINIVIL,ZESTRIL) 40 MG tablet, Take 40 mg by mouth daily. , Disp: , Rfl:  .  loratadine (CLARITIN) 10 MG tablet, Take 10 mg by mouth daily., Disp: , Rfl:  .  Magnesium 250 MG TABS, Take 250 mg by mouth daily., Disp: , Rfl:  .  ONETOUCH VERIO test strip, , Disp: , Rfl:  .  oxyCODONE-acetaminophen (ROXICET) 5-325 MG tablet, Take 1 tablet by mouth every 4 (four) hours as needed for severe pain., Disp: 30 tablet, Rfl: 0 .  pioglitazone (ACTOS) 15 MG tablet, Take 15 mg by mouth daily. , Disp: , Rfl:  .  simvastatin (ZOCOR) 40 MG tablet, Take 40 mg by mouth daily. , Disp: , Rfl:  .  TRESIBA FLEXTOUCH 200 UNIT/ML SOPN, Inject 80 Units into the skin daily. , Disp: , Rfl:  .  TRULICITY 1.5 MG/0.5ML SOPN, Inject 1.5 mg into the skin once a week. , Disp: , Rfl:   Allergies  Allergen Reactions  . Codeine Nausea And Vomiting   Objective: Vascular  Examination: Capillary refill time immediate x 9 digits and distal stump remaining hallux right foot Dorsalis pedis and Posterior tibial pulses present b/l No digital hair x 10 digits Skin temperature warm to cool b/l  Dermatological Examination: Skin with normal turgor, texture and tone b/l  Toenails 1-5 left foot, 2 through 5 right foot discolored, thick, dystrophic with subungual debris and pain with palpation to nailbeds due to thickness of nails.  Hyperkeratotic lesion submetatarsal head 1, 3 right foot  No open wounds noted on today.  Right hallux stump with well-healed incision. No signs of friction/skin irritation. No edema, no erythema, no warmth, no flocculence.  Musculoskeletal: Muscle strength 5/5 to all LE muscle groups  Amputation right hallux  Neurological: Sensation intact with 10 gram monofilament  Assessment: 1. Painful onychomycosis toenails 1-5 left foot, 2 through 5 right foot 2. Callus submet heads 1, 3 right foot 3. S/p right hallux amputation 4. NIDDM with Diabetic neuropathy  Plan: 1. Continue diabetic foot care principles.  Literature dispensed on today. 2. Toenails 1 through 5 left, 2 through 5 right foot were debrided in length and girth without iatrogenic bleeding. 3. Hyperkeratotic lesion pared with sterile chisel blade right foot submet heads 1, 3  4. Patient to continue diabetic shoe gear daily 5. Patient to report any pedal injuries to medical professional  6. Follow up 10  weeks.   7. Patient/POA to call should there be a concern in the interim.

## 2019-03-12 ENCOUNTER — Ambulatory Visit (INDEPENDENT_AMBULATORY_CARE_PROVIDER_SITE_OTHER): Payer: BLUE CROSS/BLUE SHIELD | Admitting: Podiatry

## 2019-03-12 ENCOUNTER — Other Ambulatory Visit: Payer: Self-pay

## 2019-03-12 DIAGNOSIS — L84 Corns and callosities: Secondary | ICD-10-CM | POA: Diagnosis not present

## 2019-03-12 DIAGNOSIS — B351 Tinea unguium: Secondary | ICD-10-CM

## 2019-03-12 DIAGNOSIS — M79674 Pain in right toe(s): Secondary | ICD-10-CM | POA: Diagnosis not present

## 2019-03-12 DIAGNOSIS — M79675 Pain in left toe(s): Secondary | ICD-10-CM | POA: Diagnosis not present

## 2019-03-12 DIAGNOSIS — E1142 Type 2 diabetes mellitus with diabetic polyneuropathy: Secondary | ICD-10-CM | POA: Diagnosis not present

## 2019-03-12 NOTE — Patient Instructions (Signed)

## 2019-03-18 ENCOUNTER — Encounter: Payer: Self-pay | Admitting: Podiatry

## 2019-03-18 NOTE — Progress Notes (Signed)
Subjective: Crystal Dudley is a 47 y.o. y.o. female who presents today with cc of painful, discolored, thick toenails and painful calluses which interfere with daily activities. Pain is aggravated when wearing enclosed shoe gear and relieved with periodic professional debridement.  [name], MD is her PCP. Last visit was 08/31/2018.  She continues to take gabapentin for neuropathy.   She voices no new problems on today's visit.   Current Outpatient Medications:  .  aspirin 81 MG tablet, Take 81 mg by mouth daily., Disp: , Rfl:  .  Cholecalciferol (VITAMIN D3) 5000 units CAPS, Take 5,000 Units by mouth daily., Disp: , Rfl:  .  Cranberry-Vitamin C (AZO CRANBERRY URINARY TRACT PO), Take 1 tablet by mouth daily., Disp: , Rfl:  .  gabapentin (NEURONTIN) 300 MG capsule, Take 1 capsule (300 mg total) by mouth 3 (three) times daily. Start with nightly only. Can slowly increase to three times a day as directed., Disp: 30 capsule, Rfl: 3 .  hydrochlorothiazide (HYDRODIURIL) 25 MG tablet, Take 25 mg by mouth daily., Disp: , Rfl:  .  INVOKAMET XR [phone] MG TB24, Take 2 tablets by mouth daily. , Disp: , Rfl:  .  lisinopril (PRINIVIL,ZESTRIL) 40 MG tablet, Take 40 mg by mouth daily. , Disp: , Rfl:  .  loratadine (CLARITIN) 10 MG tablet, Take 10 mg by mouth daily., Disp: , Rfl:  .  Magnesium 250 MG TABS, Take 250 mg by mouth daily., Disp: , Rfl:  .  ONETOUCH VERIO test strip, , Disp: , Rfl:  .  oxyCODONE-acetaminophen (ROXICET) 5-325 MG tablet, Take 1 tablet by mouth every 4 (four) hours as needed for severe pain., Disp: 30 tablet, Rfl: 0 .  pioglitazone (ACTOS) 15 MG tablet, Take 15 mg by mouth daily. , Disp: , Rfl:  .  simvastatin (ZOCOR) 40 MG tablet, Take 40 mg by mouth daily. , Disp: , Rfl:  .  TRESIBA FLEXTOUCH 200 UNIT/ML SOPN, Inject 80 Units into the skin daily. , Disp: , Rfl:  .  TRULICITY 1.5 MG/0.5ML SOPN, Inject 1.5 mg into the skin once a week. , Disp: , Rfl:   Allergies   Allergen Reactions  . Codeine Nausea And Vomiting    Objective:  Vascular Examination: Capillary refill time immediate x 10 digits.  Dorsalis pedis pulses present b/l.  Posterior tibial pulses present b/l.  Digital hair absent x 9digits.  Skin temperature gradient warm to cool b/l.  Dermatological Examination: Skin with normal turgor, texture and tone b/l.  Toenails 1-5 left foot, 2-5 right foot discolored, thick, dystrophic with subungual debris and pain with palpation to nailbeds due to thickness of nails.  Hyperkeratotic lesion submet heads 1, 3 right foot. No erythema, no edema, no drainage, no flocculence noted.    No open wounds noted b/l.  Musculoskeletal: Muscle strength 5/5 to all LE muscle groups  Amputation right hallux  Neurological: Sensation intact with 10 gram monofilament.  Assessment: Painful onychomycosis toenails 1-5 left foot, 2-5 right foot 2.  Callus submet head 1, 3 right foot 4.  NIDDM with neuropathy  Plan: 1. Continue diabetic foot care principles. Literature dispensed on today. 2. Toenails  1-5 left foot, 2-5 right foot were debrided in length and girth without iatrogenic bleeding. 3. Hyperkeratotic lesion(s) submet head 1, 3 right foot pared with sterile scalpel blade without incident. 4. Patient to continue soft, supportive shoe gear daily. 5. Patient to report any pedal injuries to medical professional immediately. 6. Follow up 10 weeks.  7.  Patient/POA to call should there be a concern in the interim.

## 2019-05-20 DIAGNOSIS — E538 Deficiency of other specified B group vitamins: Secondary | ICD-10-CM | POA: Insufficient documentation

## 2019-05-21 ENCOUNTER — Ambulatory Visit (INDEPENDENT_AMBULATORY_CARE_PROVIDER_SITE_OTHER): Payer: BLUE CROSS/BLUE SHIELD | Admitting: Podiatry

## 2019-05-21 ENCOUNTER — Other Ambulatory Visit: Payer: Self-pay

## 2019-05-21 ENCOUNTER — Encounter: Payer: Self-pay | Admitting: Podiatry

## 2019-05-21 VITALS — Temp 97.9°F

## 2019-05-21 DIAGNOSIS — M79675 Pain in left toe(s): Secondary | ICD-10-CM | POA: Diagnosis not present

## 2019-05-21 DIAGNOSIS — Z89419 Acquired absence of unspecified great toe: Secondary | ICD-10-CM | POA: Diagnosis not present

## 2019-05-21 DIAGNOSIS — E1142 Type 2 diabetes mellitus with diabetic polyneuropathy: Secondary | ICD-10-CM

## 2019-05-21 DIAGNOSIS — B351 Tinea unguium: Secondary | ICD-10-CM | POA: Diagnosis not present

## 2019-05-21 DIAGNOSIS — M79674 Pain in right toe(s): Secondary | ICD-10-CM

## 2019-05-21 DIAGNOSIS — L84 Corns and callosities: Secondary | ICD-10-CM | POA: Diagnosis not present

## 2019-05-21 NOTE — Patient Instructions (Signed)
Diabetes Mellitus and Foot Care Foot care is an important part of your health, especially when you have diabetes. Diabetes may cause you to have problems because of poor blood flow (circulation) to your feet and legs, which can cause your skin to:  Become thinner and drier.  Break more easily.  Heal more slowly.  Peel and crack. You may also have nerve damage (neuropathy) in your legs and feet, causing decreased feeling in them. This means that you may not notice minor injuries to your feet that could lead to more serious problems. Noticing and addressing any potential problems early is the best way to prevent future foot problems. How to care for your feet Foot hygiene  Wash your feet daily with warm water and mild soap. Do not use hot water. Then, pat your feet and the areas between your toes until they are completely dry. Do not soak your feet as this can dry your skin.  Trim your toenails straight across. Do not dig under them or around the cuticle. File the edges of your nails with an emery board or nail file.  Apply a moisturizing lotion or petroleum jelly to the skin on your feet and to dry, brittle toenails. Use lotion that does not contain alcohol and is unscented. Do not apply lotion between your toes. Shoes and socks  Wear clean socks or stockings every day. Make sure they are not too tight. Do not wear knee-high stockings since they may decrease blood flow to your legs.  Wear shoes that fit properly and have enough cushioning. Always look in your shoes before you put them on to be sure there are no objects inside.  To break in new shoes, wear them for just a few hours a day. This prevents injuries on your feet. Wounds, scrapes, corns, and calluses  Check your feet daily for blisters, cuts, bruises, sores, and redness. If you cannot see the bottom of your feet, use a mirror or ask someone for help.  Do not cut corns or calluses or try to remove them with medicine.  If you  find a minor scrape, cut, or break in the skin on your feet, keep it and the skin around it clean and dry. You may clean these areas with mild soap and water. Do not clean the area with peroxide, alcohol, or iodine.  If you have a wound, scrape, corn, or callus on your foot, look at it several times a day to make sure it is healing and not infected. Check for: ? Redness, swelling, or pain. ? Fluid or blood. ? Warmth. ? Pus or a bad smell. General instructions  Do not cross your legs. This may decrease blood flow to your feet.  Do not use heating pads or hot water bottles on your feet. They may burn your skin. If you have lost feeling in your feet or legs, you may not know this is happening until it is too late.  Protect your feet from hot and cold by wearing shoes, such as at the beach or on hot pavement.  Schedule a complete foot exam at least once a year (annually) or more often if you have foot problems. If you have foot problems, report any cuts, sores, or bruises to your health care provider immediately. Contact a health care provider if:  You have a medical condition that increases your risk of infection and you have any cuts, sores, or bruises on your feet.  You have an injury that is not   healing.  You have redness on your legs or feet.  You feel burning or tingling in your legs or feet.  You have pain or cramps in your legs and feet.  Your legs or feet are numb.  Your feet always feel cold.  You have pain around a toenail. Get help right away if:  You have a wound, scrape, corn, or callus on your foot and: ? You have pain, swelling, or redness that gets worse. ? You have fluid or blood coming from the wound, scrape, corn, or callus. ? Your wound, scrape, corn, or callus feels warm to the touch. ? You have pus or a bad smell coming from the wound, scrape, corn, or callus. ? You have a fever. ? You have a red line going up your leg. Summary  Check your feet every day  for cuts, sores, red spots, swelling, and blisters.  Moisturize feet and legs daily.  Wear shoes that fit properly and have enough cushioning.  If you have foot problems, report any cuts, sores, or bruises to your health care provider immediately.  Schedule a complete foot exam at least once a year (annually) or more often if you have foot problems. This information is not intended to replace advice given to you by your health care provider. Make sure you discuss any questions you have with your health care provider. Document Released: 12/06/2000 Document Revised: 01/21/2018 Document Reviewed: 01/10/2017 Elsevier Interactive Patient Education  2019 [employer]  Onychomycosis/Fungal Toenails  WHAT IS IT? An infection that lies within the keratin of your nail plate that is caused by a fungus.  WHY ME? Fungal infections affect all ages, sexes, races, and creeds.  There may be many factors that predispose you to a fungal infection such as age, coexisting medical conditions such as diabetes, or an autoimmune disease; stress, medications, fatigue, genetics, etc.  Bottom line: fungus thrives in a warm, moist environment and your shoes offer such a location.  IS IT CONTAGIOUS? Theoretically, yes.  You do not want to share shoes, nail clippers or files with someone who has fungal toenails.  Walking around barefoot in the same room or sleeping in the same bed is unlikely to transfer the organism.  It is important to realize, however, that fungus can spread easily from one nail to the next on the same foot.  HOW DO WE TREAT THIS?  There are several ways to treat this condition.  Treatment may depend on many factors such as age, medications, pregnancy, liver and kidney conditions, etc.  It is best to ask your doctor which options are available to you.  1. No treatment.   Unlike many other medical concerns, you can live with this condition.  However for many people this can be a painful condition and  may lead to ingrown toenails or a bacterial infection.  It is recommended that you keep the nails cut short to help reduce the amount of fungal nail. 2. Topical treatment.  These range from herbal remedies to prescription strength nail lacquers.  About 40-50% effective, topicals require twice daily application for approximately 9 to 12 months or until an entirely new nail has grown out.  The most effective topicals are medical grade medications available through physicians offices. 3. Oral antifungal medications.  With an 80-90% cure rate, the most common oral medication requires 3 to 4 months of therapy and stays in your system for a year as the new nail grows out.  Oral antifungal medications do require   blood work to make sure it is a safe drug for you.  A liver function panel will be performed prior to starting the medication and after the first month of treatment.  It is important to have the blood work performed to avoid any harmful side effects.  In general, this medication safe but blood work is required. 4. Laser Therapy.  This treatment is performed by applying a specialized laser to the affected nail plate.  This therapy is noninvasive, fast, and non-painful.  It is not covered by insurance and is therefore, out of pocket.  The results have been very good with a 80-95% cure rate.  [facility] is the only practice in the area to offer this therapy. 5. Permanent Nail Avulsion.  Removing the entire nail so that a new nail will not grow back. 

## 2019-05-23 ENCOUNTER — Encounter: Payer: Self-pay | Admitting: Podiatry

## 2019-05-23 NOTE — Progress Notes (Signed)
Subjective: Crystal Dudley presents for preventative diabetic foot care today. She has history of diabetic neuropathy. Patient seen for follow up of chronic, painful mycotic toenails and calluses.   Lupita RaiderShaw, Kimberlee, MD is her PCP.    Current Outpatient Medications:  .  aspirin 81 MG tablet, Take 81 mg by mouth daily., Disp: , Rfl:  .  azelastine (ASTELIN) 0.1 % nasal spray, USE 2 SPRAY(S) IN EACH NOSTRIL TWICE DAILY, Disp: , Rfl:  .  Cholecalciferol (VITAMIN D3) 5000 units CAPS, Take 5,000 Units by mouth daily., Disp: , Rfl:  .  Cranberry-Vitamin C (AZO CRANBERRY URINARY TRACT PO), Take 1 tablet by mouth daily., Disp: , Rfl:  .  gabapentin (NEURONTIN) 300 MG capsule, Take 1 capsule (300 mg total) by mouth 3 (three) times daily. Start with nightly only. Can slowly increase to three times a day as directed., Disp: 30 capsule, Rfl: 3 .  hydrochlorothiazide (HYDRODIURIL) 25 MG tablet, Take 25 mg by mouth daily., Disp: , Rfl:  .  Insulin Glargine, 1 Unit Dial, (TOUJEO SOLOSTAR) 300 UNIT/ML SOPN, Inject into the skin., Disp: , Rfl:  .  INVOKAMET XR [phone] MG TB24, Take 2 tablets by mouth daily. , Disp: , Rfl:  .  lisinopril (PRINIVIL,ZESTRIL) 40 MG tablet, Take 40 mg by mouth daily. , Disp: , Rfl:  .  loratadine (CLARITIN) 10 MG tablet, Take 10 mg by mouth daily., Disp: , Rfl:  .  Magnesium 250 MG TABS, Take 250 mg by mouth daily., Disp: , Rfl:  .  ONETOUCH VERIO test strip, , Disp: , Rfl:  .  oxyCODONE-acetaminophen (ROXICET) 5-325 MG tablet, Take 1 tablet by mouth every 4 (four) hours as needed for severe pain., Disp: 30 tablet, Rfl: 0 .  pioglitazone (ACTOS) 15 MG tablet, Take 15 mg by mouth daily. , Disp: , Rfl:  .  simvastatin (ZOCOR) 40 MG tablet, Take 40 mg by mouth daily. , Disp: , Rfl:  .  TRESIBA FLEXTOUCH 200 UNIT/ML SOPN, Inject 80 Units into the skin daily. , Disp: , Rfl:  .  TRULICITY 1.5 MG/0.5ML SOPN, Inject 1.5 mg into the skin once a week. , Disp: , Rfl:   Allergies   Allergen Reactions  . Codeine Nausea And Vomiting    Objective: Vitals:   05/21/19 0831  Temp: 97.9 F (36.6 C)    Vascular Examination: Capillary refill time immediate x 10 digits.  Dorsalis pedis pulses present b/l.  Posterior tibial pulses present b/l.  No digital hair x 9 digits.   Skin temperature warm to cool b/l.  Dermatological Examination: Skin with normal turgor, texture and tone b/l.  Toenails 1-5 left foot, 2-5 right foot discolored, thick, dystrophic with subungual debris and pain with palpation to nailbeds due to thickness of nails.  Hyperkeratotic lesion(s) submet heads 1, 3 right foot. No erythema, no edema, no drainage, no flocculence noted.   No open wounds b/l.   Musculoskeletal: Muscle strength 5/5 to all LE muscle groups.  Partial hallux amputation right hallux.  Neurological: Sensation intact with 10 gram monofilament.  Assessment: 1. Painful onychomycosis toenails 1-5 left, 2-5 right 2. Calluses submet head 1, 3 right foot  3. NIDDM with neuropathy 4. S/p partial hallux amputation right hallux  Plan: 1. Toenails 1-5 left, 2-5 right were debrided in length and girth without iatrogenic bleeding. 2. Calluses pared submet head 1, 3 right foot utilizing sterile scalpel blade without incident.  3. Patient to continue soft, supportive shoe gear daily. 4. Patient to report  any pedal injuries to medical professional immediately. 5. Follow up 10 weeks 6. Patient/POA to call should there be a concern in the interim.

## 2019-07-30 ENCOUNTER — Ambulatory Visit (INDEPENDENT_AMBULATORY_CARE_PROVIDER_SITE_OTHER): Payer: BC Managed Care – PPO | Admitting: Podiatry

## 2019-07-30 ENCOUNTER — Other Ambulatory Visit: Payer: Self-pay

## 2019-07-30 ENCOUNTER — Ambulatory Visit (INDEPENDENT_AMBULATORY_CARE_PROVIDER_SITE_OTHER): Payer: BC Managed Care – PPO

## 2019-07-30 ENCOUNTER — Encounter: Payer: Self-pay | Admitting: Podiatry

## 2019-07-30 VITALS — Temp 97.3°F

## 2019-07-30 DIAGNOSIS — Z89419 Acquired absence of unspecified great toe: Secondary | ICD-10-CM

## 2019-07-30 DIAGNOSIS — Q828 Other specified congenital malformations of skin: Secondary | ICD-10-CM | POA: Diagnosis not present

## 2019-07-30 DIAGNOSIS — B351 Tinea unguium: Secondary | ICD-10-CM

## 2019-07-30 DIAGNOSIS — S90851A Superficial foreign body, right foot, initial encounter: Secondary | ICD-10-CM | POA: Diagnosis not present

## 2019-07-30 DIAGNOSIS — L84 Corns and callosities: Secondary | ICD-10-CM | POA: Diagnosis not present

## 2019-07-30 DIAGNOSIS — M79674 Pain in right toe(s): Secondary | ICD-10-CM

## 2019-07-30 DIAGNOSIS — M79675 Pain in left toe(s): Secondary | ICD-10-CM | POA: Diagnosis not present

## 2019-07-30 NOTE — Patient Instructions (Signed)
Diabetes Mellitus and Foot Care Foot care is an important part of your health, especially when you have diabetes. Diabetes may cause you to have problems because of poor blood flow (circulation) to your feet and legs, which can cause your skin to:  Become thinner and drier.  Break more easily.  Heal more slowly.  Peel and crack. You may also have nerve damage (neuropathy) in your legs and feet, causing decreased feeling in them. This means that you may not notice minor injuries to your feet that could lead to more serious problems. Noticing and addressing any potential problems early is the best way to prevent future foot problems. How to care for your feet Foot hygiene  Wash your feet daily with warm water and mild soap. Do not use hot water. Then, pat your feet and the areas between your toes until they are completely dry. Do not soak your feet as this can dry your skin.  Trim your toenails straight across. Do not dig under them or around the cuticle. File the edges of your nails with an emery board or nail file.  Apply a moisturizing lotion or petroleum jelly to the skin on your feet and to dry, brittle toenails. Use lotion that does not contain alcohol and is unscented. Do not apply lotion between your toes. Shoes and socks  Wear clean socks or stockings every day. Make sure they are not too tight. Do not wear knee-high stockings since they may decrease blood flow to your legs.  Wear shoes that fit properly and have enough cushioning. Always look in your shoes before you put them on to be sure there are no objects inside.  To break in new shoes, wear them for just a few hours a day. This prevents injuries on your feet. Wounds, scrapes, corns, and calluses  Check your feet daily for blisters, cuts, bruises, sores, and redness. If you cannot see the bottom of your feet, use a mirror or ask someone for help.  Do not cut corns or calluses or try to remove them with medicine.  If you  find a minor scrape, cut, or break in the skin on your feet, keep it and the skin around it clean and dry. You may clean these areas with mild soap and water. Do not clean the area with peroxide, alcohol, or iodine.  If you have a wound, scrape, corn, or callus on your foot, look at it several times a day to make sure it is healing and not infected. Check for: ? Redness, swelling, or pain. ? Fluid or blood. ? Warmth. ? Pus or a bad smell. General instructions  Do not cross your legs. This may decrease blood flow to your feet.  Do not use heating pads or hot water bottles on your feet. They may burn your skin. If you have lost feeling in your feet or legs, you may not know this is happening until it is too late.  Protect your feet from hot and cold by wearing shoes, such as at the beach or on hot pavement.  Schedule a complete foot exam at least once a year (annually) or more often if you have foot problems. If you have foot problems, report any cuts, sores, or bruises to your health care provider immediately. Contact a health care provider if:  You have a medical condition that increases your risk of infection and you have any cuts, sores, or bruises on your feet.  You have an injury that is not   healing.  You have redness on your legs or feet.  You feel burning or tingling in your legs or feet.  You have pain or cramps in your legs and feet.  Your legs or feet are numb.  Your feet always feel cold.  You have pain around a toenail. Get help right away if:  You have a wound, scrape, corn, or callus on your foot and: ? You have pain, swelling, or redness that gets worse. ? You have fluid or blood coming from the wound, scrape, corn, or callus. ? Your wound, scrape, corn, or callus feels warm to the touch. ? You have pus or a bad smell coming from the wound, scrape, corn, or callus. ? You have a fever. ? You have a red line going up your leg. Summary  Check your feet every day  for cuts, sores, red spots, swelling, and blisters.  Moisturize feet and legs daily.  Wear shoes that fit properly and have enough cushioning.  If you have foot problems, report any cuts, sores, or bruises to your health care provider immediately.  Schedule a complete foot exam at least once a year (annually) or more often if you have foot problems. This information is not intended to replace advice given to you by your health care provider. Make sure you discuss any questions you have with your health care provider. Document Released: 12/06/2000 Document Revised: 01/21/2018 Document Reviewed: 01/10/2017 Elsevier Patient Education  2020 [employer]   Onychomycosis/Fungal Toenails  WHAT IS IT? An infection that lies within the keratin of your nail plate that is caused by a fungus.  WHY ME? Fungal infections affect all ages, sexes, races, and creeds.  There may be many factors that predispose you to a fungal infection such as age, coexisting medical conditions such as diabetes, or an autoimmune disease; stress, medications, fatigue, genetics, etc.  Bottom line: fungus thrives in a warm, moist environment and your shoes offer such a location.  IS IT CONTAGIOUS? Theoretically, yes.  You do not want to share shoes, nail clippers or files with someone who has fungal toenails.  Walking around barefoot in the same room or sleeping in the same bed is unlikely to transfer the organism.  It is important to realize, however, that fungus can spread easily from one nail to the next on the same foot.  HOW DO WE TREAT THIS?  There are several ways to treat this condition.  Treatment may depend on many factors such as age, medications, pregnancy, liver and kidney conditions, etc.  It is best to ask your doctor which options are available to you.  1. No treatment.   Unlike many other medical concerns, you can live with this condition.  However for many people this can be a painful condition and may lead to  ingrown toenails or a bacterial infection.  It is recommended that you keep the nails cut short to help reduce the amount of fungal nail. 2. Topical treatment.  These range from herbal remedies to prescription strength nail lacquers.  About 40-50% effective, topicals require twice daily application for approximately 9 to 12 months or until an entirely new nail has grown out.  The most effective topicals are medical grade medications available through physicians offices. 3. Oral antifungal medications.  With an 80-90% cure rate, the most common oral medication requires 3 to 4 months of therapy and stays in your system for a year as the new nail grows out.  Oral antifungal medications do require   blood work to make sure it is a safe drug for you.  A liver function panel will be performed prior to starting the medication and after the first month of treatment.  It is important to have the blood work performed to avoid any harmful side effects.  In general, this medication safe but blood work is required. 4. Laser Therapy.  This treatment is performed by applying a specialized laser to the affected nail plate.  This therapy is noninvasive, fast, and non-painful.  It is not covered by insurance and is therefore, out of pocket.  The results have been very good with a 80-95% cure rate.  [facility] is the only practice in the area to offer this therapy. 5. Permanent Nail Avulsion.  Removing the entire nail so that a new nail will not grow back. 

## 2019-08-01 NOTE — Progress Notes (Signed)
Subjective: Crystal Dudley presents to clinic for at-risk footcare with h/o diabetic neuropathy, painful, discolored, thick toenails b/l and chronic calluses right foot which pose a risk due to her diabetes.  She relates pain under one of her plantar calluses.  She admits to walking barefoot in her home.   Mayra Neer, MD is her PCP.    Current Outpatient Medications:  .  Dapagliflozin-metFORMIN HCl ER (XIGDUO XR) 04-999 MG TB24, Take by mouth., Disp: , Rfl:  .  aspirin 81 MG tablet, Take 81 mg by mouth daily., Disp: , Rfl:  .  azelastine (ASTELIN) 0.1 % nasal spray, USE 2 SPRAY(S) IN EACH NOSTRIL TWICE DAILY, Disp: , Rfl:  .  Cholecalciferol (VITAMIN D3) 5000 units CAPS, Take 5,000 Units by mouth daily., Disp: , Rfl:  .  Cranberry-Vitamin C (AZO CRANBERRY URINARY TRACT PO), Take 1 tablet by mouth daily., Disp: , Rfl:  .  gabapentin (NEURONTIN) 300 MG capsule, Take 1 capsule (300 mg total) by mouth 3 (three) times daily. Start with nightly only. Can slowly increase to three times a day as directed., Disp: 30 capsule, Rfl: 3 .  hydrochlorothiazide (HYDRODIURIL) 25 MG tablet, Take 25 mg by mouth daily., Disp: , Rfl:  .  Insulin Glargine, 1 Unit Dial, (TOUJEO SOLOSTAR) 300 UNIT/ML SOPN, Inject into the skin., Disp: , Rfl:  .  INVOKAMET XR [phone] MG TB24, Take 2 tablets by mouth daily. , Disp: , Rfl:  .  lisinopril (PRINIVIL,ZESTRIL) 40 MG tablet, Take 40 mg by mouth daily. , Disp: , Rfl:  .  loratadine (CLARITIN) 10 MG tablet, Take 10 mg by mouth daily., Disp: , Rfl:  .  Magnesium 250 MG TABS, Take 250 mg by mouth daily., Disp: , Rfl:  .  ONETOUCH VERIO test strip, , Disp: , Rfl:  .  oxyCODONE-acetaminophen (ROXICET) 5-325 MG tablet, Take 1 tablet by mouth every 4 (four) hours as needed for severe pain., Disp: 30 tablet, Rfl: 0 .  pioglitazone (ACTOS) 15 MG tablet, Take 15 mg by mouth daily. , Disp: , Rfl:  .  simvastatin (ZOCOR) 40 MG tablet, Take 40 mg by mouth daily. , Disp: , Rfl:   .  TRESIBA FLEXTOUCH 200 UNIT/ML SOPN, Inject 80 Units into the skin daily. , Disp: , Rfl:  .  TRULICITY 1.5 QP/6.1PJ SOPN, Inject 1.5 mg into the skin once a week. , Disp: , Rfl:  .  XIGDUO XR 04-999 MG TB24, TAKE 2 TABLETS BY MOUTH ONCE DAILY BEFORE BREAKFAST, Disp: , Rfl:   Allergies  Allergen Reactions  . Codeine Nausea And Vomiting    Objective: Vitals:   07/30/19 0824  Temp: (!) 97.3 F (36.3 C)    Vascular Examination: Capillary refill time immediate x 10 digits.  Dorsalis pedis pulses palpable b/l.  Posterior tibial pulses palpable b/l.  Digital hair absent x 9 digits.  Skin temperature gradient warm to cool b/l.  Dermatological Examination: Skin with normal turgor, texture and tone b/l.  Toenails 1-5 left, 2-5 right discolored, thick, dystrophic with subungual debris and pain with palpation to nailbeds due to thickness of nails.  Hyperkeratotic lesion(s) submet head 1 right foot. No erythema, no edema, no drainage, no flocculence noted.   Porokeratotic lesion submet head 3 right foot with tenderness to palpation. No erythema, no edema, no drainage, no flocculence.  Musculoskeletal: Muscle strength 5/5 b/l to all LE muscle groups.  Partial hallux amputation right hallux.  No pain, crepitus or joint limitation with passive/active ROM.  Neurological:  Sensation intact with 10 gram monofilament.  Xrays negative for foreign body object right foot.  Assessment: 1. Painful onychomycosis toenails 1-5 left, 2-5 right 2. Callus submet head 1 right 3. 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 NIDDM with neuropathy 5. S/p partial hallux amputation right hallux  Plan: 1. Continue diabetic foot care principles. Literature dispensed on today. 2. Toenails 1-5 left, 2-5 right were debrided in length and girth without iatrogenic bleeding. 3. Callus pared submetatarsal head 1 right foot utilizing sterile scalpel blade without incident.  Porokeratosis submet  head 3 pared and enucleated with sterile scalpel blade without incident. 4. Xray right foot taken/reviewed with patient.  5. Educated Mrs. Leven on dangers of walking barefoot indoors or outdoors. She related understanding. Advised she may wear thick soled house slippers inside of her home. 6. Patient to continue soft, supportive shoe gear. 7. Patient to report any pedal injuries to medical professional  8. Follow up 10 weeks. 9. Patient/POA to call should there be a concern in the interim.

## 2019-09-03 IMAGING — MG DIGITAL SCREENING BILATERAL MAMMOGRAM WITH TOMO AND CAD
6 of 10 series · 6 of 30 positions shown · non-contrast
Comparison: Previous exam(s).

ACR Breast Density Category a: The breast tissue is almost entirely
fatty.

CLINICAL DATA: Screening.

EXAM:
DIGITAL SCREENING BILATERAL MAMMOGRAM WITH TOMO AND CAD

[L MLO synth-2D (1 of 2)]
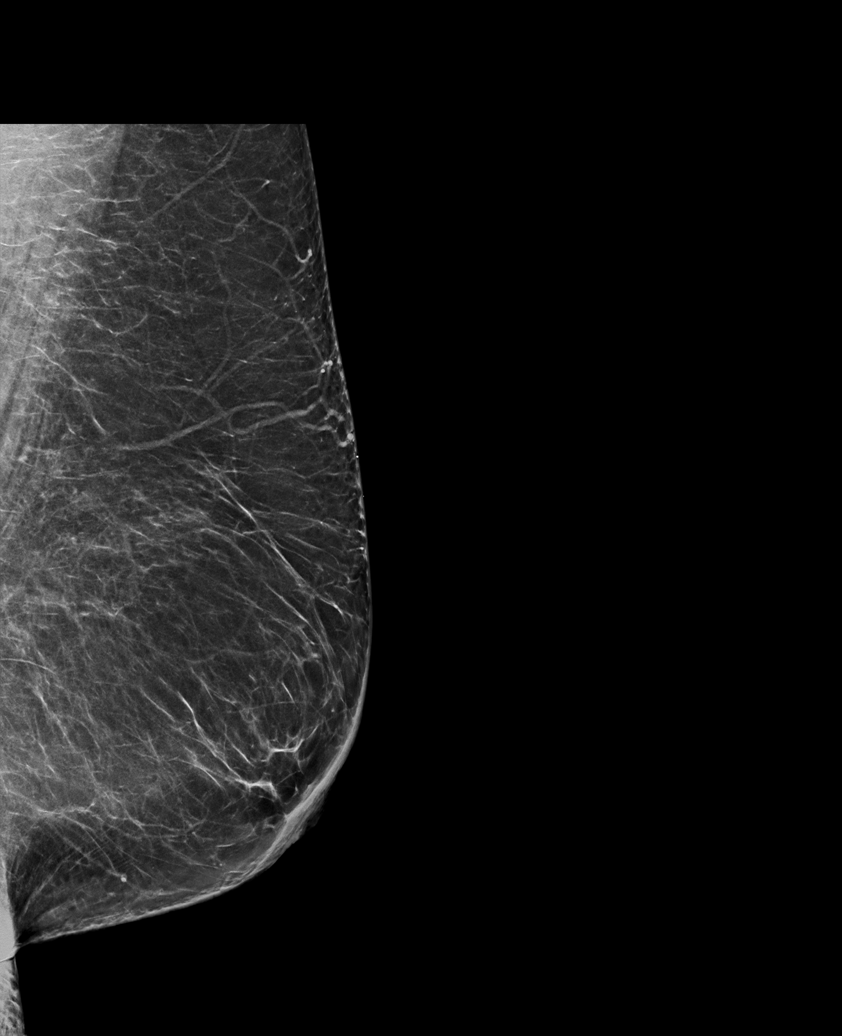

[L CC synth-2D]
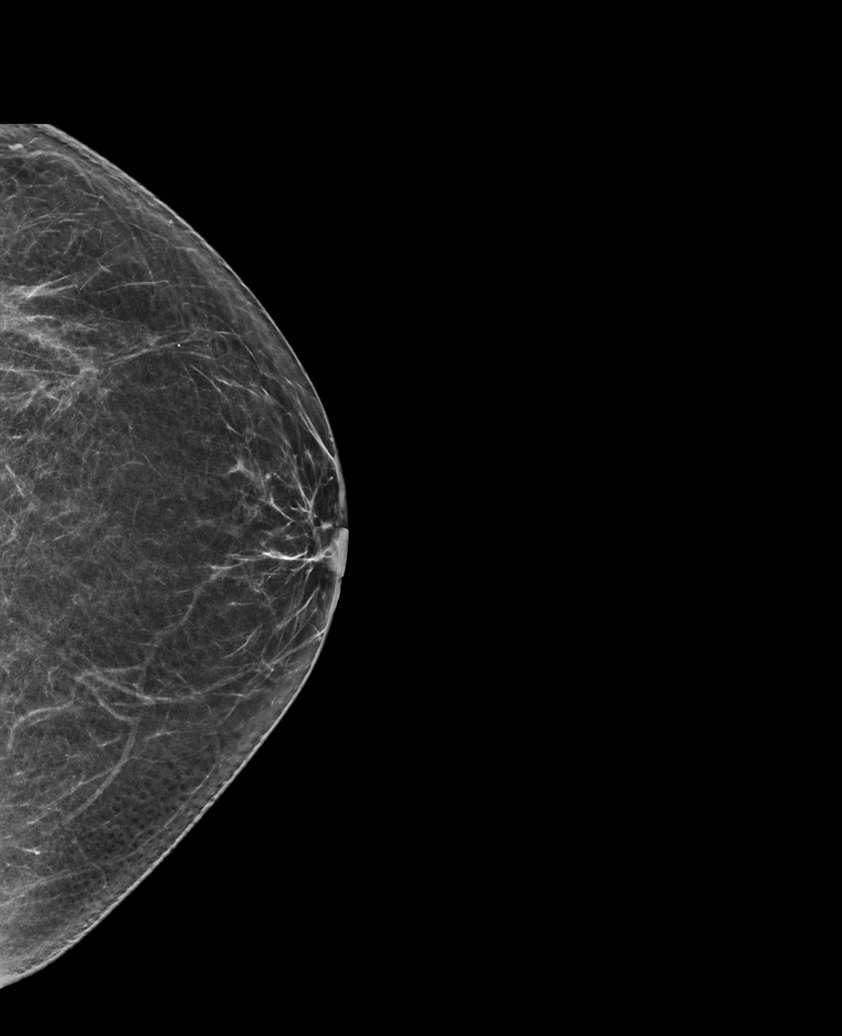

[R MLO synth-2D]
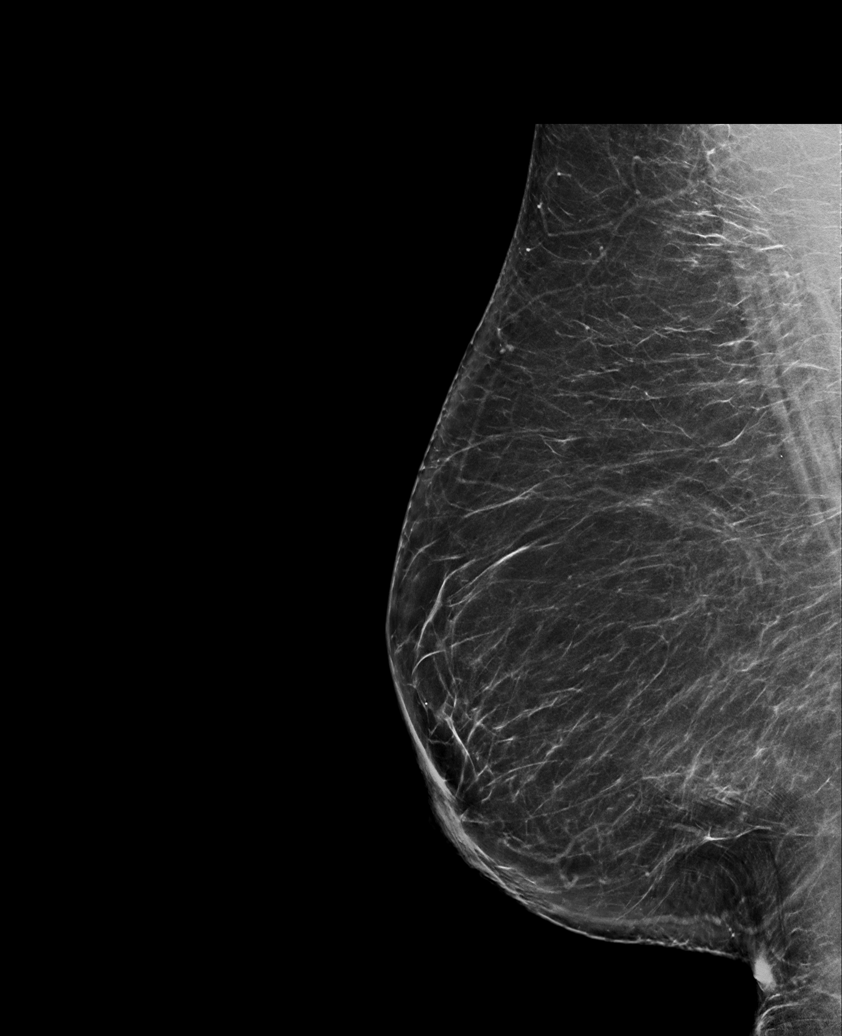

[R CC synth-2D]
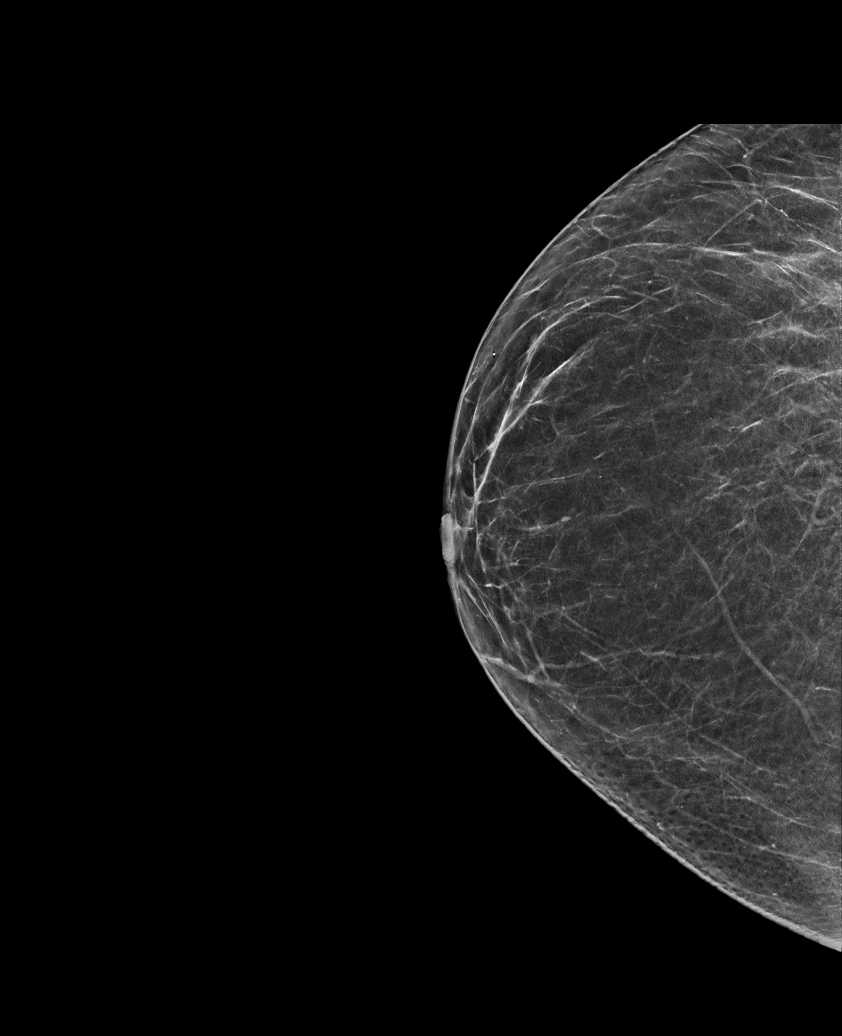

[L MLO synth-2D (2 of 2)]
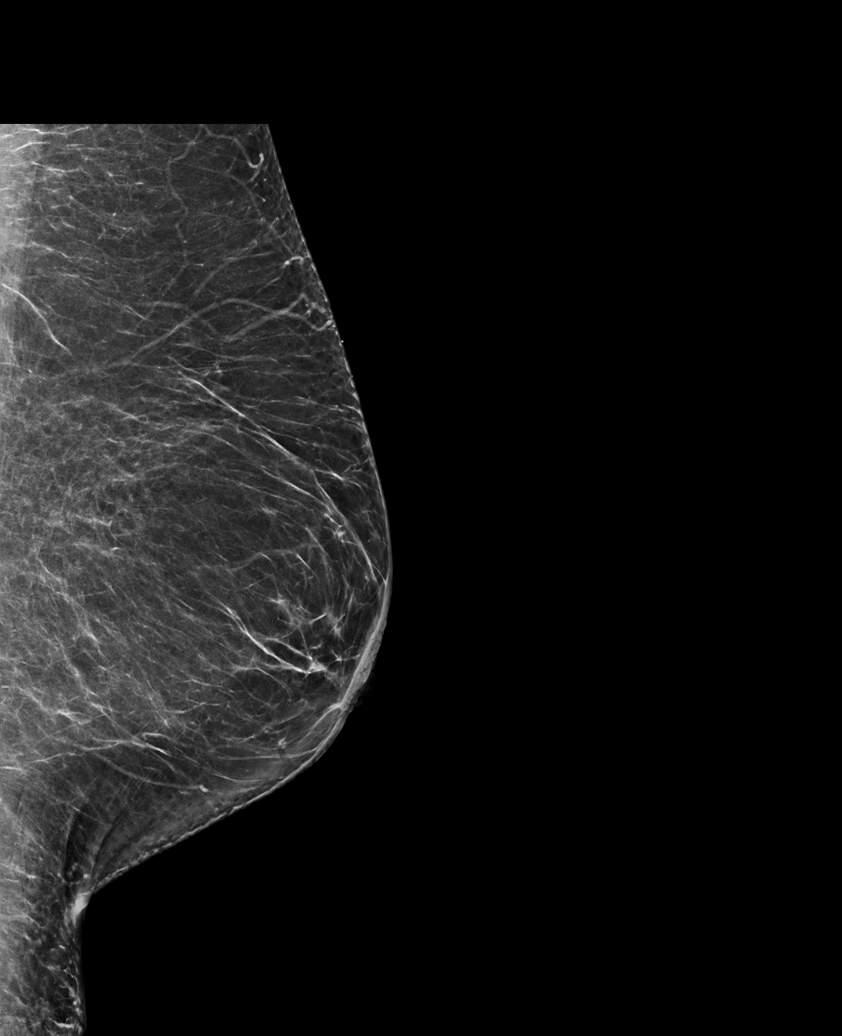

[L MLO tomo · tomo slice 38/75.0]
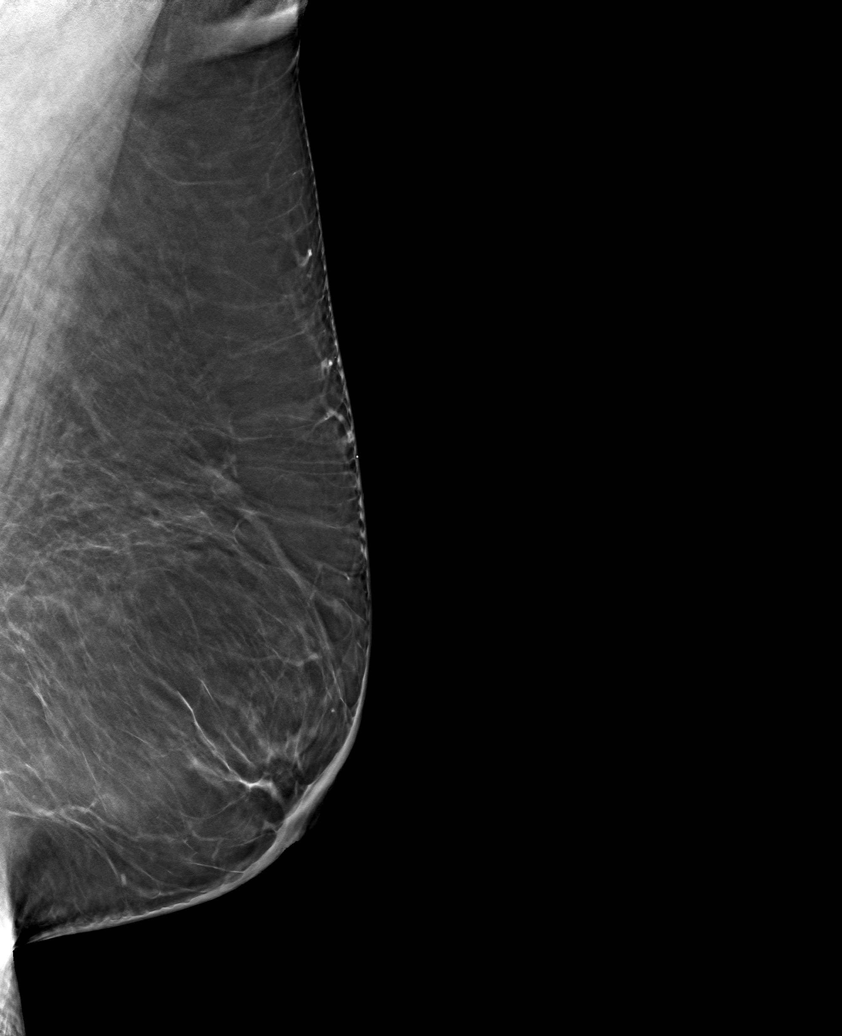

[6 of 30 positions shown; findings below may reference images not displayed]

FINDINGS: There are no findings suspicious for malignancy. Images were
processed with CAD.
IMPRESSION: No mammographic evidence of malignancy. A result letter of this
screening mammogram will be mailed directly to the patient.

RECOMMENDATION:
Screening mammogram in one year. (Code:[id number])

BI-RADS CATEGORY  1: Negative.

## 2019-10-08 ENCOUNTER — Other Ambulatory Visit: Payer: Self-pay

## 2019-10-08 ENCOUNTER — Ambulatory Visit (INDEPENDENT_AMBULATORY_CARE_PROVIDER_SITE_OTHER): Payer: BC Managed Care – PPO | Admitting: Podiatry

## 2019-10-08 ENCOUNTER — Encounter: Payer: Self-pay | Admitting: Podiatry

## 2019-10-08 DIAGNOSIS — L84 Corns and callosities: Secondary | ICD-10-CM | POA: Diagnosis not present

## 2019-10-08 DIAGNOSIS — Z89419 Acquired absence of unspecified great toe: Secondary | ICD-10-CM

## 2019-10-08 DIAGNOSIS — B351 Tinea unguium: Secondary | ICD-10-CM | POA: Diagnosis not present

## 2019-10-08 DIAGNOSIS — E1142 Type 2 diabetes mellitus with diabetic polyneuropathy: Secondary | ICD-10-CM | POA: Diagnosis not present

## 2019-10-08 DIAGNOSIS — M79674 Pain in right toe(s): Secondary | ICD-10-CM

## 2019-10-08 DIAGNOSIS — Q828 Other specified congenital malformations of skin: Secondary | ICD-10-CM

## 2019-10-08 DIAGNOSIS — M79675 Pain in left toe(s): Secondary | ICD-10-CM

## 2019-10-08 NOTE — Patient Instructions (Signed)
Diabetes Mellitus and Foot Care Foot care is an important part of your health, especially when you have diabetes. Diabetes may cause you to have problems because of poor blood flow (circulation) to your feet and legs, which can cause your skin to:  Become thinner and drier.  Break more easily.  Heal more slowly.  Peel and crack. You may also have nerve damage (neuropathy) in your legs and feet, causing decreased feeling in them. This means that you may not notice minor injuries to your feet that could lead to more serious problems. Noticing and addressing any potential problems early is the best way to prevent future foot problems. How to care for your feet Foot hygiene  Wash your feet daily with warm water and mild soap. Do not use hot water. Then, pat your feet and the areas between your toes until they are completely dry. Do not soak your feet as this can dry your skin.  Trim your toenails straight across. Do not dig under them or around the cuticle. File the edges of your nails with an emery board or nail file.  Apply a moisturizing lotion or petroleum jelly to the skin on your feet and to dry, brittle toenails. Use lotion that does not contain alcohol and is unscented. Do not apply lotion between your toes. Shoes and socks  Wear clean socks or stockings every day. Make sure they are not too tight. Do not wear knee-high stockings since they may decrease blood flow to your legs.  Wear shoes that fit properly and have enough cushioning. Always look in your shoes before you put them on to be sure there are no objects inside.  To break in new shoes, wear them for just a few hours a day. This prevents injuries on your feet. Wounds, scrapes, corns, and calluses  Check your feet daily for blisters, cuts, bruises, sores, and redness. If you cannot see the bottom of your feet, use a mirror or ask someone for help.  Do not cut corns or calluses or try to remove them with medicine.  If you  find a minor scrape, cut, or break in the skin on your feet, keep it and the skin around it clean and dry. You may clean these areas with mild soap and water. Do not clean the area with peroxide, alcohol, or iodine.  If you have a wound, scrape, corn, or callus on your foot, look at it several times a day to make sure it is healing and not infected. Check for: ? Redness, swelling, or pain. ? Fluid or blood. ? Warmth. ? Pus or a bad smell. General instructions  Do not cross your legs. This may decrease blood flow to your feet.  Do not use heating pads or hot water bottles on your feet. They may burn your skin. If you have lost feeling in your feet or legs, you may not know this is happening until it is too late.  Protect your feet from hot and cold by wearing shoes, such as at the beach or on hot pavement.  Schedule a complete foot exam at least once a year (annually) or more often if you have foot problems. If you have foot problems, report any cuts, sores, or bruises to your health care provider immediately. Contact a health care provider if:  You have a medical condition that increases your risk of infection and you have any cuts, sores, or bruises on your feet.  You have an injury that is not   healing.  You have redness on your legs or feet.  You feel burning or tingling in your legs or feet.  You have pain or cramps in your legs and feet.  Your legs or feet are numb.  Your feet always feel cold.  You have pain around a toenail. Get help right away if:  You have a wound, scrape, corn, or callus on your foot and: ? You have pain, swelling, or redness that gets worse. ? You have fluid or blood coming from the wound, scrape, corn, or callus. ? Your wound, scrape, corn, or callus feels warm to the touch. ? You have pus or a bad smell coming from the wound, scrape, corn, or callus. ? You have a fever. ? You have a red line going up your leg. Summary  Check your feet every day  for cuts, sores, red spots, swelling, and blisters.  Moisturize feet and legs daily.  Wear shoes that fit properly and have enough cushioning.  If you have foot problems, report any cuts, sores, or bruises to your health care provider immediately.  Schedule a complete foot exam at least once a year (annually) or more often if you have foot problems. This information is not intended to replace advice given to you by your health care provider. Make sure you discuss any questions you have with your health care provider. Document Released: 12/06/2000 Document Revised: 01/21/2018 Document Reviewed: 01/10/2017 Elsevier Patient Education  2020 [employer]  

## 2019-10-08 NOTE — Progress Notes (Signed)
Subjective: Crystal Dudley presents with diabetes, diabetic neuropathy for at-risk foot care. She has h/o partial amputation right hallux.   She voices no new pedal concerns on today's visit.  [name], MD is her PCP.   Current Outpatient Medications on File Prior to Visit  Medication Sig Dispense Refill  . aspirin 81 MG tablet Take 81 mg by mouth daily.    Marland Kitchen azelastine (ASTELIN) 0.1 % nasal spray USE 2 SPRAY(S) IN EACH NOSTRIL TWICE DAILY    . Cholecalciferol (VITAMIN D3) 5000 units CAPS Take 5,000 Units by mouth daily.    . Cranberry-Vitamin C (AZO CRANBERRY URINARY TRACT PO) Take 1 tablet by mouth daily.    . Dapagliflozin-metFORMIN HCl ER (XIGDUO XR) 04-999 MG TB24 Take by mouth.    . gabapentin (NEURONTIN) 300 MG capsule Take 1 capsule (300 mg total) by mouth 3 (three) times daily. Start with nightly only. Can slowly increase to three times a day as directed. 30 capsule 3  . hydrochlorothiazide (HYDRODIURIL) 25 MG tablet Take 25 mg by mouth daily.    . Insulin Glargine, 1 Unit Dial, (TOUJEO SOLOSTAR) 300 UNIT/ML SOPN Inject into the skin.    . INVOKAMET XR [phone] MG TB24 Take 2 tablets by mouth daily.     Marland Kitchen lisinopril (PRINIVIL,ZESTRIL) 40 MG tablet Take 40 mg by mouth daily.     Marland Kitchen loratadine (CLARITIN) 10 MG tablet Take 10 mg by mouth daily.    . Magnesium 250 MG TABS Take 250 mg by mouth daily.    Letta Pate VERIO test strip     . oxyCODONE-acetaminophen (ROXICET) 5-325 MG tablet Take 1 tablet by mouth every 4 (four) hours as needed for severe pain. 30 tablet 0  . pioglitazone (ACTOS) 15 MG tablet Take 15 mg by mouth daily.     . simvastatin (ZOCOR) 40 MG tablet Take 40 mg by mouth daily.     . TRESIBA FLEXTOUCH 200 UNIT/ML SOPN Inject 80 Units into the skin daily.     . TRULICITY 1.5 MG/0.5ML SOPN Inject 1.5 mg into the skin once a week.     . vitamin B-12 (CYANOCOBALAMIN) 1000 MCG tablet Take 1 tablet once a day    . XIGDUO XR 04-999 MG TB24 TAKE 2 TABLETS BY MOUTH  ONCE DAILY BEFORE BREAKFAST     No current facility-administered medications on file prior to visit.     Allergies  Allergen Reactions  . Codeine Nausea And Vomiting  Objective: There were no vitals filed for this visit.  Vascular Examination: Capillary refill time immediate x 10.  Dorsalis pedis and Posterior tibial pulses palpable b/l.  Digital hair absent b/l.  Skin temperature gradient WNL b/l.  Dermatological Examination: Skin with normal turgor, texture and tone b/l.  Toenails 1-5 left, 2-5 right discolored, thick, dystrophic with subungual debris and pain with palpation to nailbeds due to thickness of nails.  Hyperkeratotic lesion(s) submet head 1 right foot. No erythema, no edema, no drainage, no flocculence noted.   Porokeratotic lesions submet head 3 right foot. No erythema, no edema, no drainage, no flocculence.  Musculoskeletal: Muscle strength 5/5 to all LE muscle groups.  S/p partial hallux amputation right foot.  No pain, crepitus or joint limitation with passive/active ROM.  Neurological: Sensation intact 5/5 b/l with 10 gram monofilament bilaterally.  Assessment: 1. Painful onychomycosis toenails 1-5 left, 2-5 right 2. Callus submet head 1 right foot 3. 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 S/p partial hallux amputation  Plan:  1. Continue diabetic foot care principles. Literature dispensed on today. 2. Toenails 1-5 left, 2-5 right were debrided in length and girth without iatrogenic bleeding. 3. Calluses pared submetatarsal head(s) 1 right foot utilizing sterile scalpel blade without incident.  4. Porokeratosis submet head 3 right foot pared and enucleated with sterile scalpel blade without incident. 5. Patient to continue diabetic shoes daily. 6. Patient to report any pedal injuries to medical professional. 7. Follow up 10 weeks. 8. Patient/POA to call should there be a concern in the interim.

## 2019-12-31 ENCOUNTER — Ambulatory Visit: Payer: BC Managed Care – PPO | Admitting: Podiatry

## 2020-02-23 ENCOUNTER — Ambulatory Visit (INDEPENDENT_AMBULATORY_CARE_PROVIDER_SITE_OTHER): Payer: BC Managed Care – PPO | Admitting: Podiatry

## 2020-02-23 ENCOUNTER — Other Ambulatory Visit: Payer: Self-pay

## 2020-02-23 DIAGNOSIS — Q828 Other specified congenital malformations of skin: Secondary | ICD-10-CM

## 2020-02-23 DIAGNOSIS — E1142 Type 2 diabetes mellitus with diabetic polyneuropathy: Secondary | ICD-10-CM

## 2020-02-23 DIAGNOSIS — B351 Tinea unguium: Secondary | ICD-10-CM | POA: Diagnosis not present

## 2020-02-23 DIAGNOSIS — Z89419 Acquired absence of unspecified great toe: Secondary | ICD-10-CM

## 2020-02-23 DIAGNOSIS — E08621 Diabetes mellitus due to underlying condition with foot ulcer: Secondary | ICD-10-CM

## 2020-02-23 DIAGNOSIS — L84 Corns and callosities: Secondary | ICD-10-CM | POA: Diagnosis not present

## 2020-02-23 DIAGNOSIS — L97521 Non-pressure chronic ulcer of other part of left foot limited to breakdown of skin: Secondary | ICD-10-CM

## 2020-02-23 MED ORDER — MUPIROCIN 2 % EX OINT: TOPICAL_OINTMENT | CUTANEOUS | 1 refills | Status: AC

## 2020-02-23 NOTE — Patient Instructions (Addendum)
DRESSING CHANGES LEFT FOOT:  WEAR SURGICAL SHOE AT ALL TIMES    1. KEEP LEFT  FOOT DRY AT ALL TIMES!!!!  2. CLEANSE ULCER WITH SALINE.  3. DAB DRY WITH GAUZE SPONGE.  4. APPLY A LIGHT AMOUNT OF MUPIROCIN TO BASE OF ULCER.  5. APPLY OUTER DRESSING/BAND-AID AS INSTRUCTED.  6. WEAR SURGICAL SHOE DAILY AT ALL TIMES.  7. DO NOT WALK BAREFOOT!!!  8.  IF YOU EXPERIENCE ANY FEVER, CHILLS, NIGHTSWEATS, NAUSEA OR VOMITING, ELEVATED OR LOW BLOOD SUGARS, REPORT TO EMERGENCY ROOM.  9. IF YOU EXPERIENCE INCREASED REDNESS, PAIN, SWELLING, DISCOLORATION, ODOR, PUS, DRAINAGE OR WARMTH OF YOUR FOOT, REPORT TO EMERGENCY ROOM.   Diabetes Mellitus and Foot Care Foot care is an important part of your health, especially when you have diabetes. Diabetes may cause you to have problems because of poor blood flow (circulation) to your feet and legs, which can cause your skin to:  Become thinner and drier.  Break more easily.  Heal more slowly.  Peel and crack. You may also have nerve damage (neuropathy) in your legs and feet, causing decreased feeling in them. This means that you may not notice minor injuries to your feet that could lead to more serious problems. Noticing and addressing any potential problems early is the best way to prevent future foot problems. How to care for your feet Foot hygiene  Wash your feet daily with warm water and mild soap. Do not use hot water. Then, pat your feet and the areas between your toes until they are completely dry. Do not soak your feet as this can dry your skin.  Trim your toenails straight across. Do not dig under them or around the cuticle. File the edges of your nails with an emery board or nail file.  Apply a moisturizing lotion or petroleum jelly to the skin on your feet and to dry, brittle toenails. Use lotion that does not contain alcohol and is unscented. Do not apply lotion between your toes. Shoes and socks  Wear clean socks or stockings every  day. Make sure they are not too tight. Do not wear knee-high stockings since they may decrease blood flow to your legs.  Wear shoes that fit properly and have enough cushioning. Always look in your shoes before you put them on to be sure there are no objects inside.  To break in new shoes, wear them for just a few hours a day. This prevents injuries on your feet. Wounds, scrapes, corns, and calluses  Check your feet daily for blisters, cuts, bruises, sores, and redness. If you cannot see the bottom of your feet, use a mirror or ask someone for help.  Do not cut corns or calluses or try to remove them with medicine.  If you find a minor scrape, cut, or break in the skin on your feet, keep it and the skin around it clean and dry. You may clean these areas with mild soap and water. Do not clean the area with peroxide, alcohol, or iodine.  If you have a wound, scrape, corn, or callus on your foot, look at it several times a day to make sure it is healing and not infected. Check for: ? Redness, swelling, or pain. ? Fluid or blood. ? Warmth. ? Pus or a bad smell. General instructions  Do not cross your legs. This may decrease blood flow to your feet.  Do not use heating pads or hot water bottles on your feet. They may burn your skin.   If you have lost feeling in your feet or legs, you may not know this is happening until it is too late.  Protect your feet from hot and cold by wearing shoes, such as at the beach or on hot pavement.  Schedule a complete foot exam at least once a year (annually) or more often if you have foot problems. If you have foot problems, report any cuts, sores, or bruises to your health care provider immediately. Contact a health care provider if:  You have a medical condition that increases your risk of infection and you have any cuts, sores, or bruises on your feet.  You have an injury that is not healing.  You have redness on your legs or feet.  You feel burning  or tingling in your legs or feet.  You have pain or cramps in your legs and feet.  Your legs or feet are numb.  Your feet always feel cold.  You have pain around a toenail. Get help right away if:  You have a wound, scrape, corn, or callus on your foot and: ? You have pain, swelling, or redness that gets worse. ? You have fluid or blood coming from the wound, scrape, corn, or callus. ? Your wound, scrape, corn, or callus feels warm to the touch. ? You have pus or a bad smell coming from the wound, scrape, corn, or callus. ? You have a fever. ? You have a red line going up your leg. Summary  Check your feet every day for cuts, sores, red spots, swelling, and blisters.  Moisturize feet and legs daily.  Wear shoes that fit properly and have enough cushioning.  If you have foot problems, report any cuts, sores, or bruises to your health care provider immediately.  Schedule a complete foot exam at least once a year (annually) or more often if you have foot problems. This information is not intended to replace advice given to you by your health care provider. Make sure you discuss any questions you have with your health care provider. Document Revised: 09/01/2019 Document Reviewed: 01/10/2017 Elsevier Patient Education  2020 Elsevier Inc.  

## 2020-02-28 ENCOUNTER — Encounter: Payer: Self-pay | Admitting: Podiatry

## 2020-02-28 NOTE — Progress Notes (Signed)
Subjective: Patient presents today with diabetes and cc of painful, discolored, thick toenails which interfere with daily activities. Pain is aggravated when wearing enclosed shoe gear. Pain is getting progressively worse and relieved with periodic professional debridement.  She is requesting new diabetic shoes on today's visit.  Crystal Raider, MD is her PCP.   Current Outpatient Medications on File Prior to Visit  Medication Sig Dispense Refill  . aspirin 81 MG tablet Take 81 mg by mouth daily.    Marland Kitchen azelastine (ASTELIN) 0.1 % nasal spray USE 2 SPRAY(S) IN EACH NOSTRIL TWICE DAILY    . Cholecalciferol (VITAMIN D3) 5000 units CAPS Take 5,000 Units by mouth daily.    . Cranberry-Vitamin C (AZO CRANBERRY URINARY TRACT PO) Take 1 tablet by mouth daily.    . Dapagliflozin-metFORMIN HCl ER (XIGDUO XR) 04-999 MG TB24 Take by mouth.    . gabapentin (NEURONTIN) 300 MG capsule Take 1 capsule (300 mg total) by mouth 3 (three) times daily. Start with nightly only. Can slowly increase to three times a day as directed. 30 capsule 3  . hydrochlorothiazide (HYDRODIURIL) 25 MG tablet Take 25 mg by mouth daily.    . Insulin Glargine, 1 Unit Dial, (TOUJEO SOLOSTAR) 300 UNIT/ML SOPN Inject into the skin.    . INVOKAMET XR [phone] MG TB24 Take 2 tablets by mouth daily.     Marland Kitchen lisinopril (PRINIVIL,ZESTRIL) 40 MG tablet Take 40 mg by mouth daily.     Marland Kitchen loratadine (CLARITIN) 10 MG tablet Take 10 mg by mouth daily.    . Magnesium 250 MG TABS Take 250 mg by mouth daily.    Letta Pate VERIO test strip     . oxyCODONE-acetaminophen (ROXICET) 5-325 MG tablet Take 1 tablet by mouth every 4 (four) hours as needed for severe pain. 30 tablet 0  . pioglitazone (ACTOS) 15 MG tablet Take 15 mg by mouth daily.     . simvastatin (ZOCOR) 40 MG tablet Take 40 mg by mouth daily.     . TRESIBA FLEXTOUCH 200 UNIT/ML SOPN Inject 80 Units into the skin daily.     . TRULICITY 1.5 MG/0.5ML SOPN Inject 1.5 mg into the skin once a week.      . vitamin B-12 (CYANOCOBALAMIN) 1000 MCG tablet Take 1 tablet once a day    . XIGDUO XR 04-999 MG TB24 TAKE 2 TABLETS BY MOUTH ONCE DAILY BEFORE BREAKFAST     No current facility-administered medications on file prior to visit.     Allergies  Allergen Reactions  . Codeine Nausea And Vomiting    Objective: There were no vitals filed for this visit.  49 y.o. female WD, WN IN NAD. AAO X 3.  Capillary refill time to digits immediate b/l. Palpable DP pulses b/l. Palpable PT pulses b/l. Pedal hair absent b/l Skin temperature gradient within normal limits b/l.  Pedal skin with normal turgor, texture and tone bilaterally. No open wounds bilaterally. No interdigital macerations bilaterally.   Toenails 1-5 left, 2-5 right elongated, dystrophic, thickened, crumbly with subungual debris.   Hyperkeratotic lesion(s) submet head 1 right foot with subdermal hemorrhage.  No erythema, no edema, no drainage, no flocculence.   Porokeratotic lesion(s) submet head 3 right foot. No erythema, no edema, no drainage, no flocculence.   Ulceration located submet head 1 left foot. Predebridement measures 2.0 x 2.0 cm with subdermal hemorrhage and flocculence. There is an underlying superficial ulceration which measures 0.3 x 0.5 x 0.2 cm to level of subcutaneous tissue.  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  -Calluses submet head 1 right were debrided without complication or incident. Total number debrided = one. -Painful porokeratotic lesions submet head 3 right foot pared and enucleated with sterile scalpel blade without incident. -Patient to continue soft, supportive shoe gear daily. -Patient to report any pedal injuries to medical professional immediately. -Surgical shoe was dispensed for left foot. Applied PegAssist insert to [place] shoe. Offloaded ulcer. Patient to wear daily.  -Patient was given written instructions on offloading and dressing changes/aftercare and was instructed to call immediately if any signs or symptoms of infection arise.  -Patient instructed to report to emergency department with worsening appearance of ulcer/toe/foot, increased pain, foul odor, increased redness, swelling, drainage, fever, chills, nightsweats, nausea, vomiting, increased blood sugar.  --Prescription written for Mupirocin Ointment. Patient is to apply to ulcer plantar aspect left foot once daily and cover with dressing.  -Patient/POA to call should there be question/concern in the interim.  Return in about 2 weeks (around 03/08/2020) for diabetic ulcer LEFT foot.

## 2020-03-08 ENCOUNTER — Ambulatory Visit: Payer: BC Managed Care – PPO | Admitting: Podiatry

## 2020-03-09 ENCOUNTER — Encounter: Payer: Self-pay | Admitting: Podiatry

## 2020-03-09 ENCOUNTER — Ambulatory Visit (INDEPENDENT_AMBULATORY_CARE_PROVIDER_SITE_OTHER): Payer: BC Managed Care – PPO | Admitting: Podiatry

## 2020-03-09 ENCOUNTER — Other Ambulatory Visit: Payer: Self-pay

## 2020-03-09 VITALS — Temp 97.3°F

## 2020-03-09 DIAGNOSIS — E08621 Diabetes mellitus due to underlying condition with foot ulcer: Secondary | ICD-10-CM

## 2020-03-09 DIAGNOSIS — E1142 Type 2 diabetes mellitus with diabetic polyneuropathy: Secondary | ICD-10-CM | POA: Diagnosis not present

## 2020-03-09 DIAGNOSIS — L97521 Non-pressure chronic ulcer of other part of left foot limited to breakdown of skin: Secondary | ICD-10-CM

## 2020-03-09 NOTE — Patient Instructions (Addendum)
Continue Peg Assist Surgical Shoe for one week.  Follow up with Raiford Noble and bring your left shoe in for modification of insole.  You will be measured for new diabetic shoes with Raiford Noble. Schedule that appointment ASAP.  Follow up with me in 1 month. If you have any problems with either foot, call and come in right away. If you have problems scheduling appointment, ask for scheduling supervisor, Benn Moulder.  Diabetes Mellitus and Foot Care Foot care is an important part of your health, especially when you have diabetes. Diabetes may cause you to have problems because of poor blood flow (circulation) to your feet and legs, which can cause your skin to:  Become thinner and drier.  Break more easily.  Heal more slowly.  Peel and crack. You may also have nerve damage (neuropathy) in your legs and feet, causing decreased feeling in them. This means that you may not notice minor injuries to your feet that could lead to more serious problems. Noticing and addressing any potential problems early is the best way to prevent future foot problems. How to care for your feet Foot hygiene  Wash your feet daily with warm water and mild soap. Do not use hot water. Then, pat your feet and the areas between your toes until they are completely dry. Do not soak your feet as this can dry your skin.  Trim your toenails straight across. Do not dig under them or around the cuticle. File the edges of your nails with an emery board or nail file.  Apply a moisturizing lotion or petroleum jelly to the skin on your feet and to dry, brittle toenails. Use lotion that does not contain alcohol and is unscented. Do not apply lotion between your toes. Shoes and socks  Wear clean socks or stockings every day. Make sure they are not too tight. Do not wear knee-high stockings since they may decrease blood flow to your legs.  Wear shoes that fit properly and have enough cushioning. Always look in your shoes before you put them on  to be sure there are no objects inside.  To break in new shoes, wear them for just a few hours a day. This prevents injuries on your feet. Wounds, scrapes, corns, and calluses  Check your feet daily for blisters, cuts, bruises, sores, and redness. If you cannot see the bottom of your feet, use a mirror or ask someone for help.  Do not cut corns or calluses or try to remove them with medicine.  If you find a minor scrape, cut, or break in the skin on your feet, keep it and the skin around it clean and dry. You may clean these areas with mild soap and water. Do not clean the area with peroxide, alcohol, or iodine.  If you have a wound, scrape, corn, or callus on your foot, look at it several times a day to make sure it is healing and not infected. Check for: ? Redness, swelling, or pain. ? Fluid or blood. ? Warmth. ? Pus or a bad smell. General instructions  Do not cross your legs. This may decrease blood flow to your feet.  Do not use heating pads or hot water bottles on your feet. They may burn your skin. If you have lost feeling in your feet or legs, you may not know this is happening until it is too late.  Protect your feet from hot and cold by wearing shoes, such as at the beach or on hot pavement.  Schedule a complete foot exam at least once a year (annually) or more often if you have foot problems. If you have foot problems, report any cuts, sores, or bruises to your health care provider immediately. Contact a health care provider if:  You have a medical condition that increases your risk of infection and you have any cuts, sores, or bruises on your feet.  You have an injury that is not healing.  You have redness on your legs or feet.  You feel burning or tingling in your legs or feet.  You have pain or cramps in your legs and feet.  Your legs or feet are numb.  Your feet always feel cold.  You have pain around a toenail. Get help right away if:  You have a wound,  scrape, corn, or callus on your foot and: ? You have pain, swelling, or redness that gets worse. ? You have fluid or blood coming from the wound, scrape, corn, or callus. ? Your wound, scrape, corn, or callus feels warm to the touch. ? You have pus or a bad smell coming from the wound, scrape, corn, or callus. ? You have a fever. ? You have a red line going up your leg. Summary  Check your feet every day for cuts, sores, red spots, swelling, and blisters.  Moisturize feet and legs daily.  Wear shoes that fit properly and have enough cushioning.  If you have foot problems, report any cuts, sores, or bruises to your health care provider immediately.  Schedule a complete foot exam at least once a year (annually) or more often if you have foot problems. This information is not intended to replace advice given to you by your health care provider. Make sure you discuss any questions you have with your health care provider. Document Revised: 09/01/2019 Document Reviewed: 01/10/2017 Elsevier Patient Education  [employer].

## 2020-03-13 ENCOUNTER — Ambulatory Visit (INDEPENDENT_AMBULATORY_CARE_PROVIDER_SITE_OTHER): Payer: BC Managed Care – PPO | Admitting: Orthotics

## 2020-03-13 ENCOUNTER — Other Ambulatory Visit: Payer: Self-pay

## 2020-03-13 DIAGNOSIS — E08621 Diabetes mellitus due to underlying condition with foot ulcer: Secondary | ICD-10-CM

## 2020-03-13 DIAGNOSIS — E119 Type 2 diabetes mellitus without complications: Secondary | ICD-10-CM

## 2020-03-13 DIAGNOSIS — M2041 Other hammer toe(s) (acquired), right foot: Secondary | ICD-10-CM

## 2020-03-13 DIAGNOSIS — Z89419 Acquired absence of unspecified great toe: Secondary | ICD-10-CM

## 2020-03-13 DIAGNOSIS — L84 Corns and callosities: Secondary | ICD-10-CM

## 2020-03-13 DIAGNOSIS — L97521 Non-pressure chronic ulcer of other part of left foot limited to breakdown of skin: Secondary | ICD-10-CM

## 2020-03-13 NOTE — Progress Notes (Signed)
Patient seen today  For f/o and toe filler; also DBS.  She has Media planner.

## 2020-03-13 NOTE — Progress Notes (Signed)
Subjective:   [name] presents for continued care of ulceration of left foot.  Patient has been performing daily dressing changes to left foot daily utilizing Mupirocin Ointment. Pt. denies any new complaints.  Patient denies any fever, chills, nightsweats, nausea or vomiting.  Current Outpatient Medications on File Prior to Visit  Medication Sig Dispense Refill  . aspirin 81 MG tablet Take 81 mg by mouth daily.    Marland Kitchen azelastine (ASTELIN) 0.1 % nasal spray USE 2 SPRAY(S) IN EACH NOSTRIL TWICE DAILY    . Cholecalciferol (VITAMIN D3) 5000 units CAPS Take 5,000 Units by mouth daily.    . Cranberry-Vitamin C (AZO CRANBERRY URINARY TRACT PO) Take 1 tablet by mouth daily.    . Dapagliflozin-metFORMIN HCl ER (XIGDUO XR) 04-999 MG TB24 Take by mouth.    . gabapentin (NEURONTIN) 300 MG capsule Take 1 capsule (300 mg total) by mouth 3 (three) times daily. Start with nightly only. Can slowly increase to three times a day as directed. 30 capsule 3  . hydrochlorothiazide (HYDRODIURIL) 25 MG tablet Take 25 mg by mouth daily.    . Insulin Glargine, 1 Unit Dial, (TOUJEO SOLOSTAR) 300 UNIT/ML SOPN Inject into the skin.    . INVOKAMET XR [id number] MG TB24 Take 2 tablets by mouth daily.     Marland Kitchen lisinopril (PRINIVIL,ZESTRIL) 40 MG tablet Take 40 mg by mouth daily.     Marland Kitchen loratadine (CLARITIN) 10 MG tablet Take 10 mg by mouth daily.    . Magnesium 250 MG TABS Take 250 mg by mouth daily.    . mupirocin ointment (BACTROBAN) 2 % Apply to left foot ulcer once daily and cover with dressing. 30 g 1  . ONETOUCH VERIO test strip     . oxyCODONE-acetaminophen (ROXICET) 5-325 MG tablet Take 1 tablet by mouth every 4 (four) hours as needed for severe pain. 30 tablet 0  . pioglitazone (ACTOS) 15 MG tablet Take 15 mg by mouth daily.     . simvastatin (ZOCOR) 40 MG tablet Take 40 mg by mouth daily.     . TRESIBA FLEXTOUCH 200 UNIT/ML SOPN Inject 80 Units into the skin daily.     . TRULICITY 1.5 MG/0.5ML SOPN Inject 1.5  mg into the skin once a week.     . vitamin B-12 (CYANOCOBALAMIN) 1000 MCG tablet Take 1 tablet once a day    . XIGDUO XR 04-999 MG TB24 TAKE 2 TABLETS BY MOUTH ONCE DAILY BEFORE BREAKFAST     No current facility-administered medications on file prior to visit.     Allergies  Allergen Reactions  . Codeine Nausea And Vomiting     Objective:   Vascular Examination: Vitals:   03/09/20 0833  Temp: (!) 97.3 F (36.3 C)     Capillary refill time to digits immediate b/l. Palpable DP pulses b/l. Palpable PT pulses b/l. Pedal hair present b/l. Skin temperature gradient within normal limits b/l.  Pedal skin with normal turgor, texture and tone bilaterally. No interdigital macerations bilaterally. Toenails recently debrided and are well-maintained with no erythema, no edema, no drainage, no flocculence.Marland Kitchen  Ulceration located submet head 1 left foot.  Predebridement measurements carried out today of 1.5 x 2.0 cm.  No periulcerative erythema, no edema, no drainage. Flocculence absent.  Malodor absent.  Postdebridement measurements today are: 1.5 x 2.0 cm with subdermal hemorrhage. Lesion is completely epithelialized.  No tracking, no tunneling, no undermining. No probing to bone, no purulent drainage.  No deep abscess evident.  Normal  muscle strength 5/5 to all lower extremity muscle groups bilaterally, no pain crepitus or joint limitation noted with ROM b/l and digital amputation which is partial hallux amputation right foot  Protective sensation intact 5/5 intact bilaterally with 10g monofilament b/l Vibratory sensation absent b/l  Assessment:   1.  Diabetic Ulceration submet head 1 left, healed 2.    NIDDM with peripheral neuropathy  Plan: 1. Ulcer was debrided to the level of healthy, viable tissue. Ulcer was cleansed with wound cleanser. Triple antibiotic ointment was applied to base of wound with light dressing. 2. Continue surgical shoe left foot for one week. 3. Schedule  appointment with Crystal Dudley for offloading of calluses b/l. 4. Patient was given written instructions on dressing changes and was instructed to call immediately if any signs or symptoms of infection arise.  5. Patient is to follow up 4 weeks. 6. Patient instructed to report to emergency department with worsening appearance of ulcer/toe/foot, increased pain, foul odor, increased redness, swelling, drainage, fever, chills, nightsweats, nausea, vomiting, increased blood sugar.  7. Patient/POA related understanding.  Return in about 4 weeks (around 04/06/2020) for diabetic ulcer left foot.

## 2020-05-02 ENCOUNTER — Ambulatory Visit (INDEPENDENT_AMBULATORY_CARE_PROVIDER_SITE_OTHER): Payer: BC Managed Care – PPO | Admitting: Podiatry

## 2020-05-02 ENCOUNTER — Other Ambulatory Visit: Payer: Self-pay

## 2020-05-02 ENCOUNTER — Encounter: Payer: Self-pay | Admitting: Podiatry

## 2020-05-02 DIAGNOSIS — E1142 Type 2 diabetes mellitus with diabetic polyneuropathy: Secondary | ICD-10-CM

## 2020-05-02 DIAGNOSIS — Z8631 Personal history of diabetic foot ulcer: Secondary | ICD-10-CM | POA: Diagnosis not present

## 2020-05-02 DIAGNOSIS — L97521 Non-pressure chronic ulcer of other part of left foot limited to breakdown of skin: Secondary | ICD-10-CM

## 2020-05-02 DIAGNOSIS — Z89411 Acquired absence of right great toe: Secondary | ICD-10-CM

## 2020-05-02 NOTE — Patient Instructions (Signed)
Diabetes Mellitus and Foot Care Foot care is an important part of your health, especially when you have diabetes. Diabetes may cause you to have problems because of poor blood flow (circulation) to your feet and legs, which can cause your skin to:  Become thinner and drier.  Break more easily.  Heal more slowly.  Peel and crack. You may also have nerve damage (neuropathy) in your legs and feet, causing decreased feeling in them. This means that you may not notice minor injuries to your feet that could lead to more serious problems. Noticing and addressing any potential problems early is the best way to prevent future foot problems. How to care for your feet Foot hygiene  Wash your feet daily with warm water and mild soap. Do not use hot water. Then, pat your feet and the areas between your toes until they are completely dry. Do not soak your feet as this can dry your skin.  Trim your toenails straight across. Do not dig under them or around the cuticle. File the edges of your nails with an emery board or nail file.  Apply a moisturizing lotion or petroleum jelly to the skin on your feet and to dry, brittle toenails. Use lotion that does not contain alcohol and is unscented. Do not apply lotion between your toes. Shoes and socks  Wear clean socks or stockings every day. Make sure they are not too tight. Do not wear knee-high stockings since they may decrease blood flow to your legs.  Wear shoes that fit properly and have enough cushioning. Always look in your shoes before you put them on to be sure there are no objects inside.  To break in new shoes, wear them for just a few hours a day. This prevents injuries on your feet. Wounds, scrapes, corns, and calluses  Check your feet daily for blisters, cuts, bruises, sores, and redness. If you cannot see the bottom of your feet, use a mirror or ask someone for help.  Do not cut corns or calluses or try to remove them with medicine.  If you  find a minor scrape, cut, or break in the skin on your feet, keep it and the skin around it clean and dry. You may clean these areas with mild soap and water. Do not clean the area with peroxide, alcohol, or iodine.  If you have a wound, scrape, corn, or callus on your foot, look at it several times a day to make sure it is healing and not infected. Check for: ? Redness, swelling, or pain. ? Fluid or blood. ? Warmth. ? Pus or a bad smell. General instructions  Do not cross your legs. This may decrease blood flow to your feet.  Do not use heating pads or hot water bottles on your feet. They may burn your skin. If you have lost feeling in your feet or legs, you may not know this is happening until it is too late.  Protect your feet from hot and cold by wearing shoes, such as at the beach or on hot pavement.  Schedule a complete foot exam at least once a year (annually) or more often if you have foot problems. If you have foot problems, report any cuts, sores, or bruises to your health care provider immediately. Contact a health care provider if:  You have a medical condition that increases your risk of infection and you have any cuts, sores, or bruises on your feet.  You have an injury that is not   healing.  You have redness on your legs or feet.  You feel burning or tingling in your legs or feet.  You have pain or cramps in your legs and feet.  Your legs or feet are numb.  Your feet always feel cold.  You have pain around a toenail. Get help right away if:  You have a wound, scrape, corn, or callus on your foot and: ? You have pain, swelling, or redness that gets worse. ? You have fluid or blood coming from the wound, scrape, corn, or callus. ? Your wound, scrape, corn, or callus feels warm to the touch. ? You have pus or a bad smell coming from the wound, scrape, corn, or callus. ? You have a fever. ? You have a red line going up your leg. Summary  Check your feet every day  for cuts, sores, red spots, swelling, and blisters.  Moisturize feet and legs daily.  Wear shoes that fit properly and have enough cushioning.  If you have foot problems, report any cuts, sores, or bruises to your health care provider immediately.  Schedule a complete foot exam at least once a year (annually) or more often if you have foot problems. This information is not intended to replace advice given to you by your health care provider. Make sure you discuss any questions you have with your health care provider. Document Revised: 09/01/2019 Document Reviewed: 01/10/2017 Elsevier Patient Education  2020 [employer]  Corns and Calluses Corns are small areas of thickened skin that occur on the top, sides, or tip of a toe. They contain a cone-shaped core with a point that can press on a nerve below. This causes pain.  Calluses are areas of thickened skin that can occur anywhere on the body, including the hands, fingers, palms, soles of the feet, and heels. Calluses are usually larger than corns. What are the causes? Corns and calluses are caused by rubbing (friction) or pressure, such as from shoes that are too tight or do not fit properly. What increases the risk? Corns are more likely to develop in people who have misshapen toes (toe deformities), such as hammer toes. Calluses can occur with friction to any area of the skin. They are more likely to develop in people who:  Work with their hands.  Wear shoes that fit poorly, are too tight, or are high-heeled.  Have toe deformities. What are the signs or symptoms? Symptoms of a corn or callus include:  A hard growth on the skin.  Pain or tenderness under the skin.  Redness and swelling.  Increased discomfort while wearing tight-fitting shoes, if your feet are affected. If a corn or callus becomes infected, symptoms may include:  Redness and swelling that gets worse.  Pain.  Fluid, blood, or pus draining from the corn or  callus. How is this diagnosed? Corns and calluses may be diagnosed based on your symptoms, your medical history, and a physical exam. How is this treated? Treatment for corns and calluses may include:  Removing the cause of the friction or pressure. This may involve: ? Changing your shoes. ? Wearing shoe inserts (orthotics) or other protective layers in your shoes, such as a corn pad. ? Wearing gloves.  Applying medicine to the skin (topical medicine) to help soften skin in the hardened, thickened areas.  Removing layers of dead skin with a file to reduce the size of the corn or callus.  Removing the corn or callus with a scalpel or laser.  Taking   antibiotic medicines, if your corn or callus is infected.  Having surgery, if a toe deformity is the cause. Follow these instructions at home:   Take over-the-counter and prescription medicines only as told by your health care provider.  If you were prescribed an antibiotic, take it as told by your health care provider. Do not stop taking it even if your condition starts to improve.  Wear shoes that fit well. Avoid wearing high-heeled shoes and shoes that are too tight or too loose.  Wear any padding, protective layers, gloves, or orthotics as told by your health care provider.  Soak your hands or feet and then use a file or pumice stone to soften your corn or callus. Do this as told by your health care provider.  Check your corn or callus every day for symptoms of infection. Contact a health care provider if you:  Notice that your symptoms do not improve with treatment.  Have redness or swelling that gets worse.  Notice that your corn or callus becomes painful.  Have fluid, blood, or pus coming from your corn or callus.  Have new symptoms. Summary  Corns are small areas of thickened skin that occur on the top, sides, or tip of a toe.  Calluses are areas of thickened skin that can occur anywhere on the body, including the  hands, fingers, palms, and soles of the feet. Calluses are usually larger than corns.  Corns and calluses are caused by rubbing (friction) or pressure, such as from shoes that are too tight or do not fit properly.  Treatment may include wearing any padding, protective layers, gloves, or orthotics as told by your health care provider. This information is not intended to replace advice given to you by your health care provider. Make sure you discuss any questions you have with your health care provider. Document Revised: 03/31/2019 Document Reviewed: 10/22/2017 Elsevier Patient Education  2020 [employer] Inc[employer]  

## 2020-05-11 NOTE — Progress Notes (Signed)
Subjective:  Patient ID: Margy Clarks, female    DOB: 04-Oct-1971,  MRN: [id number]  Mrs. Couillard is seen for follow up ulcer submet head 1 left foot. She is also here to pick up her diabetic shoes. Ulcer has healed and she is now wearing her diabetic shoes.  Past Medical History:  Diagnosis Date  . Diabetes mellitus without complication ([place])    Type II     Past Surgical History:  Procedure Laterality Date  . AMPUTATION TOE Right 09/05/2017   Procedure: AMPUTATION TOE;  Surgeon: Evelina Bucy, DPM;  Location: [facility];  Service: Podiatry;  Laterality: Right;     Current Outpatient Medications on File Prior to Visit  Medication Sig Dispense Refill  . aspirin 81 MG tablet Take 81 mg by mouth daily.    Marland Kitchen azelastine (ASTELIN) 0.1 % nasal spray USE 2 SPRAY(S) IN EACH NOSTRIL TWICE DAILY    . Cholecalciferol (VITAMIN D3) 5000 units CAPS Take 5,000 Units by mouth daily.    . Cranberry-Vitamin C (AZO CRANBERRY URINARY TRACT PO) Take 1 tablet by mouth daily.    . Dapagliflozin-metFORMIN HCl ER (XIGDUO XR) 04-999 MG TB24 Take by mouth.    . gabapentin (NEURONTIN) 300 MG capsule Take 1 capsule (300 mg total) by mouth 3 (three) times daily. Start with nightly only. Can slowly increase to three times a day as directed. 30 capsule 3  . hydrochlorothiazide (HYDRODIURIL) 25 MG tablet Take 25 mg by mouth daily.    . Insulin Glargine, 1 Unit Dial, (TOUJEO SOLOSTAR) 300 UNIT/ML SOPN Inject into the skin.    . INVOKAMET XR [phone] MG TB24 Take 2 tablets by mouth daily.     Marland Kitchen lisinopril (PRINIVIL,ZESTRIL) 40 MG tablet Take 40 mg by mouth daily.     Marland Kitchen loratadine (CLARITIN) 10 MG tablet Take 10 mg by mouth daily.    . Magnesium 250 MG TABS Take 250 mg by mouth daily.    . mupirocin ointment (BACTROBAN) 2 % Apply to left foot ulcer once daily and cover with dressing. 30 g 1  . ONETOUCH VERIO test strip     . oxyCODONE-acetaminophen (ROXICET) 5-325 MG tablet Take 1 tablet by mouth every 4 (four) hours as  needed for severe pain. 30 tablet 0  . pioglitazone (ACTOS) 15 MG tablet Take 15 mg by mouth daily.     . simvastatin (ZOCOR) 40 MG tablet Take 40 mg by mouth daily.     . TRESIBA FLEXTOUCH 200 UNIT/ML SOPN Inject 80 Units into the skin daily.     . TRULICITY 1.5 XH/3.7JI SOPN Inject 1.5 mg into the skin once a week.     . vitamin B-12 (CYANOCOBALAMIN) 1000 MCG tablet Take 1 tablet once a day    . XIGDUO XR 04-999 MG TB24 TAKE 2 TABLETS BY MOUTH ONCE DAILY BEFORE BREAKFAST     No current facility-administered medications on file prior to visit.     Allergies  Allergen Reactions  . Codeine Nausea And Vomiting     Family History  Problem Relation Age of Onset  . Breast cancer Neg Hx      Social History   Occupational History  . Not on file  Tobacco Use  . Smoking status: Former Smoker    Years: 9.00    Quit date: 10/2006    Years since quitting: 13.5  . Smokeless tobacco: Never Used  Substance and Sexual Activity  . Alcohol use: Yes    Comment: occ  .  Drug use: No  . Sexual activity: Not on file     There is no immunization history on file for this patient.    Objective:  Physical Exam: There were no vitals filed for this visit.   49 y.o. Caucasian female WD, WN in NAD. AAO x 3.  Neurovascular status unchanged b/l. Capillary refill time to digits immediate b/l. Palpable DP pulses b/l. Palpable PT pulses b/l. Pedal hair present b/l. Skin temperature gradient within normal limits b/l.  Pedal skin with normal turgor, texture and tone bilaterally. No open wounds bilaterally. No interdigital macerations bilaterally. Ulcer is healed..  No images are attached to the encounter.  Wound Location: submet head 1 left foot There is no non-viable tissue present in the wound. Wound Base: Epithelialized tissue Peri-wound: Normal Exudate: None: wound tissue dry Blood Loss during debridement: 0 cc's. Description of tissue removed from ulceration today:  Callus. Signs of  clinical bacterial infection are absent.  Normal muscle strength 5/5 to all lower extremity muscle groups bilaterally. No pain crepitus or joint limitation noted with ROM b/l. digital amputation R hallux. Diabetic shoes adequate fit, but right foot toe filler is missing.  Protective sensation intact 5/5 intact bilaterally with 10g monofilament b/l. Vibratory sensation diminished b/l.   Lab Results  Component Value Date   HGBA1C 6.6 (H) 09/05/2017    Assessment:  No diagnosis found. Plan:  Patient was evaluated and treated and all questions answered.  Patient/POA/Family member educated on diagnosis and treatment plan of routine ulcer debridement/wound care.  Ulcer debrided utilizing: sterile scalpel blade. Ulcer is healed. Patient's diabetic shoes fit, but toe filler missing for right foot. Patient re-casted with foam box. Will send off and call patient when toe filler arrives.  Return in about 9 weeks (around 07/04/2020) for diabetic nail and callus trim.  [name], DPM

## 2020-05-26 ENCOUNTER — Ambulatory Visit: Payer: BC Managed Care – PPO | Admitting: Podiatry

## 2020-05-30 ENCOUNTER — Encounter: Payer: Self-pay | Admitting: Podiatry

## 2020-05-30 ENCOUNTER — Other Ambulatory Visit: Payer: Self-pay

## 2020-05-30 ENCOUNTER — Ambulatory Visit (INDEPENDENT_AMBULATORY_CARE_PROVIDER_SITE_OTHER): Payer: BC Managed Care – PPO | Admitting: Podiatry

## 2020-05-30 ENCOUNTER — Ambulatory Visit: Payer: BC Managed Care – PPO | Admitting: Orthotics

## 2020-05-30 DIAGNOSIS — Z89419 Acquired absence of unspecified great toe: Secondary | ICD-10-CM

## 2020-05-30 DIAGNOSIS — L84 Corns and callosities: Secondary | ICD-10-CM | POA: Diagnosis not present

## 2020-05-30 DIAGNOSIS — Z8631 Personal history of diabetic foot ulcer: Secondary | ICD-10-CM

## 2020-05-30 DIAGNOSIS — E119 Type 2 diabetes mellitus without complications: Secondary | ICD-10-CM | POA: Diagnosis not present

## 2020-05-30 DIAGNOSIS — B351 Tinea unguium: Secondary | ICD-10-CM

## 2020-05-30 DIAGNOSIS — E1142 Type 2 diabetes mellitus with diabetic polyneuropathy: Secondary | ICD-10-CM

## 2020-05-30 NOTE — Patient Instructions (Signed)
Diabetes Mellitus and Foot Care Foot care is an important part of your health, especially when you have diabetes. Diabetes may cause you to have problems because of poor blood flow (circulation) to your feet and legs, which can cause your skin to:  Become thinner and drier.  Break more easily.  Heal more slowly.  Peel and crack. You may also have nerve damage (neuropathy) in your legs and feet, causing decreased feeling in them. This means that you may not notice minor injuries to your feet that could lead to more serious problems. Noticing and addressing any potential problems early is the best way to prevent future foot problems. How to care for your feet Foot hygiene  Wash your feet daily with warm water and mild soap. Do not use hot water. Then, pat your feet and the areas between your toes until they are completely dry. Do not soak your feet as this can dry your skin.  Trim your toenails straight across. Do not dig under them or around the cuticle. File the edges of your nails with an emery board or nail file.  Apply a moisturizing lotion or petroleum jelly to the skin on your feet and to dry, brittle toenails. Use lotion that does not contain alcohol and is unscented. Do not apply lotion between your toes. Shoes and socks  Wear clean socks or stockings every day. Make sure they are not too tight. Do not wear knee-high stockings since they may decrease blood flow to your legs.  Wear shoes that fit properly and have enough cushioning. Always look in your shoes before you put them on to be sure there are no objects inside.  To break in new shoes, wear them for just a few hours a day. This prevents injuries on your feet. Wounds, scrapes, corns, and calluses  Check your feet daily for blisters, cuts, bruises, sores, and redness. If you cannot see the bottom of your feet, use a mirror or ask someone for help.  Do not cut corns or calluses or try to remove them with medicine.  If you  find a minor scrape, cut, or break in the skin on your feet, keep it and the skin around it clean and dry. You may clean these areas with mild soap and water. Do not clean the area with peroxide, alcohol, or iodine.  If you have a wound, scrape, corn, or callus on your foot, look at it several times a day to make sure it is healing and not infected. Check for: ? Redness, swelling, or pain. ? Fluid or blood. ? Warmth. ? Pus or a bad smell. General instructions  Do not cross your legs. This may decrease blood flow to your feet.  Do not use heating pads or hot water bottles on your feet. They may burn your skin. If you have lost feeling in your feet or legs, you may not know this is happening until it is too late.  Protect your feet from hot and cold by wearing shoes, such as at the beach or on hot pavement.  Schedule a complete foot exam at least once a year (annually) or more often if you have foot problems. If you have foot problems, report any cuts, sores, or bruises to your health care provider immediately. Contact a health care provider if:  You have a medical condition that increases your risk of infection and you have any cuts, sores, or bruises on your feet.  You have an injury that is not   healing.  You have redness on your legs or feet.  You feel burning or tingling in your legs or feet.  You have pain or cramps in your legs and feet.  Your legs or feet are numb.  Your feet always feel cold.  You have pain around a toenail. Get help right away if:  You have a wound, scrape, corn, or callus on your foot and: ? You have pain, swelling, or redness that gets worse. ? You have fluid or blood coming from the wound, scrape, corn, or callus. ? Your wound, scrape, corn, or callus feels warm to the touch. ? You have pus or a bad smell coming from the wound, scrape, corn, or callus. ? You have a fever. ? You have a red line going up your leg. Summary  Check your feet every day  for cuts, sores, red spots, swelling, and blisters.  Moisturize feet and legs daily.  Wear shoes that fit properly and have enough cushioning.  If you have foot problems, report any cuts, sores, or bruises to your health care provider immediately.  Schedule a complete foot exam at least once a year (annually) or more often if you have foot problems. This information is not intended to replace advice given to you by your health care provider. Make sure you discuss any questions you have with your health care provider. Document Revised: 09/01/2019 Document Reviewed: 01/10/2017 Elsevier Patient Education  2020 [employer]  

## 2020-05-30 NOTE — Progress Notes (Signed)
Subjective: Crystal Dudley presents today at risk foot care. Patient has h/o amputation of R hallux and painful callus(es) b/l feet and painful mycotic toenails b/l that are difficult to trim. Pain interferes with ambulation. Aggravating factors include wearing enclosed shoe gear. Pain is relieved with periodic professional debridement.   She will also be seeing Raiford Noble today for recasting for diabetic insoles.  Crystal Raider, Crystal Dudley is patient's PCP.   Past Medical History:  Diagnosis Date  . Diabetes mellitus without complication (HCC)    Type II     Current Outpatient Medications on File Prior to Visit  Medication Sig Dispense Refill  . aspirin 81 MG tablet Take 81 mg by mouth daily.    Marland Kitchen azelastine (ASTELIN) 0.1 % nasal spray USE 2 SPRAY(S) IN EACH NOSTRIL TWICE DAILY    . Cholecalciferol (VITAMIN D3) 5000 units CAPS Take 5,000 Units by mouth daily.    . Cranberry-Vitamin C (AZO CRANBERRY URINARY TRACT PO) Take 1 tablet by mouth daily.    . Dapagliflozin-metFORMIN HCl ER (XIGDUO XR) 04-999 MG TB24 Take by mouth.    . gabapentin (NEURONTIN) 300 MG capsule Take 1 capsule (300 mg total) by mouth 3 (three) times daily. Start with nightly only. Can slowly increase to three times a day as directed. 30 capsule 3  . hydrochlorothiazide (HYDRODIURIL) 25 MG tablet Take 25 mg by mouth daily.    . Insulin Glargine, 1 Unit Dial, (TOUJEO SOLOSTAR) 300 UNIT/ML SOPN Inject into the skin.    . INVOKAMET XR [id number] MG TB24 Take 2 tablets by mouth daily.     Marland Kitchen lisinopril (PRINIVIL,ZESTRIL) 40 MG tablet Take 40 mg by mouth daily.     Marland Kitchen loratadine (CLARITIN) 10 MG tablet Take 10 mg by mouth daily.    . Magnesium 250 MG TABS Take 250 mg by mouth daily.    . mupirocin ointment (BACTROBAN) 2 % Apply to left foot ulcer once daily and cover with dressing. 30 g 1  . ONETOUCH VERIO test strip     . oxyCODONE-acetaminophen (ROXICET) 5-325 MG tablet Take 1 tablet by mouth every 4 (four) hours as needed for severe  pain. 30 tablet 0  . pioglitazone (ACTOS) 15 MG tablet Take 15 mg by mouth daily.     . simvastatin (ZOCOR) 40 MG tablet Take 40 mg by mouth daily.     . TRESIBA FLEXTOUCH 200 UNIT/ML SOPN Inject 80 Units into the skin daily.     . TRULICITY 1.5 MG/0.5ML SOPN Inject 1.5 mg into the skin once a week.     . vitamin B-12 (CYANOCOBALAMIN) 1000 MCG tablet Take 1 tablet once a day    . XIGDUO XR 04-999 MG TB24 TAKE 2 TABLETS BY MOUTH ONCE DAILY BEFORE BREAKFAST     No current facility-administered medications on file prior to visit.     Allergies  Allergen Reactions  . Codeine Nausea And Vomiting    Objective: Crystal Dudley is a pleasant 49 y.o. y.o. Patient Race: White or Caucasian [1]  female in NAD. AAO x 3.  There were no vitals filed for this visit.  Vascular Examination: Neurovascular status unchanged b/l lower extremities. Capillary refill time to digits immediate b/l. Palpable DP pulses b/l. Palpable PT pulses b/l. Pedal hair present b/l. Skin temperature gradient within normal limits b/l. No pain with calf compression b/l. No edema noted b/l.  Dermatological Examination: Pedal skin with normal turgor, texture and tone bilaterally. No open wounds bilaterally. No interdigital macerations bilaterally. Toenails  1-5 left, R 2nd toe, R 3rd toe, R 4th toe and R 5th toe elongated, discolored, dystrophic, thickened, and crumbly with subungual debris and tenderness to dorsal palpation. Hyperkeratotic lesion(s) submet head 1 left foot, submet head 1 right foot and submet head 4 right foot.  No erythema, no edema, no drainage, no flocculence.  Musculoskeletal: Normal muscle strength 5/5 to all lower extremity muscle groups bilaterally. No pain crepitus or joint limitation noted with ROM b/l. Lower extremity amputation(s): partial amputation of R hallux.  Neurological Examination: Protective sensation intact 5/5 intact bilaterally with 10g monofilament b/l. Vibratory sensation intact b/l.  Proprioception intact bilaterally.  Assessment: 1. Onychomycosis   2. Callus   3. History of amputation of hallux ([place])   4. Diabetes mellitus without complication ([place])   Plan: -Examined patient. -No new findings. No new orders. -Toenails 1-5 left, R 2nd toe, R 3rd toe, R 4th toe and R 5th toe debrided in length and girth without iatrogenic bleeding with sterile nail nipper and dremel.  -Callus(es) submet head 1 left foot, submet head 1 right foot and submet head 4 right foot pared utilizing sterile scalpel blade without complication or incident. Total number debrided =3. -Patient to continue diabetic shoes daily. She will be recasted by Pedorthist on today's visit. -Patient to report any pedal injuries to medical professional immediately. -Patient/POA to call should there be question/concern in the interim.  Return in about 10 weeks (around 08/08/2020) for diabetic nail and callus trim.  Marzetta Board, DPM

## 2020-05-30 NOTE — Progress Notes (Signed)
Toe filler is just for hallux partial amp (distal); will not aide in ambulation.

## 2020-08-11 ENCOUNTER — Other Ambulatory Visit: Payer: Self-pay

## 2020-08-11 ENCOUNTER — Ambulatory Visit (INDEPENDENT_AMBULATORY_CARE_PROVIDER_SITE_OTHER): Payer: BC Managed Care – PPO | Admitting: Podiatry

## 2020-08-11 DIAGNOSIS — B351 Tinea unguium: Secondary | ICD-10-CM | POA: Diagnosis not present

## 2020-08-11 DIAGNOSIS — Z89419 Acquired absence of unspecified great toe: Secondary | ICD-10-CM

## 2020-08-11 DIAGNOSIS — E1142 Type 2 diabetes mellitus with diabetic polyneuropathy: Secondary | ICD-10-CM

## 2020-08-11 DIAGNOSIS — L84 Corns and callosities: Secondary | ICD-10-CM

## 2020-08-12 ENCOUNTER — Encounter: Payer: Self-pay | Admitting: Podiatry

## 2020-08-12 NOTE — Progress Notes (Signed)
Subjective: Crystal Dudley presents today at risk foot care. Patient has h/o amputation of R hallux and painful callus(es) b/l feet and painful mycotic toenails b/l that are difficult to trim. Pain interferes with ambulation. Aggravating factors include wearing enclosed shoe gear. Pain is relieved with periodic professional debridement.   She notes the improvement of her callus on left foot after Rick modified her diabetic insoles.  [name], MD is patient's PCP. Last visit was September of 2020.  Past Medical History:  Diagnosis Date  . Diabetes mellitus without complication (HCC)    Type II     Current Outpatient Medications on File Prior to Visit  Medication Sig Dispense Refill  . aspirin 81 MG tablet Take 81 mg by mouth daily.    Marland Kitchen azelastine (ASTELIN) 0.1 % nasal spray USE 2 SPRAY(S) IN EACH NOSTRIL TWICE DAILY    . Cholecalciferol (VITAMIN D3) 5000 units CAPS Take 5,000 Units by mouth daily.    . Cranberry-Vitamin C (AZO CRANBERRY URINARY TRACT PO) Take 1 tablet by mouth daily.    . Dapagliflozin-metFORMIN HCl ER (XIGDUO XR) 04-999 MG TB24 Take by mouth.    . gabapentin (NEURONTIN) 300 MG capsule Take 1 capsule (300 mg total) by mouth 3 (three) times daily. Start with nightly only. Can slowly increase to three times a day as directed. 30 capsule 3  . hydrochlorothiazide (HYDRODIURIL) 25 MG tablet Take 25 mg by mouth daily.    . Insulin Glargine, 1 Unit Dial, (TOUJEO SOLOSTAR) 300 UNIT/ML SOPN Inject into the skin.    . INVOKAMET XR [phone] MG TB24 Take 2 tablets by mouth daily.     Marland Kitchen lisinopril (PRINIVIL,ZESTRIL) 40 MG tablet Take 40 mg by mouth daily.     Marland Kitchen loratadine (CLARITIN) 10 MG tablet Take 10 mg by mouth daily.    . Magnesium 250 MG TABS Take 250 mg by mouth daily.    . mupirocin ointment (BACTROBAN) 2 % Apply to left foot ulcer once daily and cover with dressing. 30 g 1  . ONETOUCH VERIO test strip     . oxyCODONE-acetaminophen (ROXICET) 5-325 MG tablet Take 1  tablet by mouth every 4 (four) hours as needed for severe pain. 30 tablet 0  . pioglitazone (ACTOS) 15 MG tablet Take 15 mg by mouth daily.     . simvastatin (ZOCOR) 40 MG tablet Take 40 mg by mouth daily.     . TRESIBA FLEXTOUCH 200 UNIT/ML SOPN Inject 80 Units into the skin daily.     . vitamin B-12 (CYANOCOBALAMIN) 1000 MCG tablet Take 1 tablet once a day    . XIGDUO XR 04-999 MG TB24 TAKE 2 TABLETS BY MOUTH ONCE DAILY BEFORE BREAKFAST    . TRULICITY 1.5 MG/0.5ML SOPN Inject 1.5 mg into the skin once a week.     . TRULICITY 3 MG/0.5ML SOPN INJECT 3 MG ONCE A WEEK FOR DIABETES DOSE INCREASE     No current facility-administered medications on file prior to visit.     Allergies  Allergen Reactions  . Codeine Nausea And Vomiting    Objective: Crystal Dudley is a pleasant 49 y.o. female, obese, in NAD. AAO x 3.  There were no vitals filed for this visit.  Vascular Examination: Neurovascular status unchanged b/l lower extremities. Capillary refill time to digits immediate b/l. Palpable DP pulses b/l. Palpable PT pulses b/l. Pedal hair present b/l. Skin temperature gradient within normal limits b/l. No pain with calf compression b/l. No edema noted b/l.  Dermatological Examination: Pedal skin with normal turgor, texture and tone bilaterally. No open wounds bilaterally. No interdigital macerations bilaterally. Toenails 1-5 left, R 2nd toe, R 3rd toe, R 4th toe and R 5th toe elongated, discolored, dystrophic, thickened, and crumbly with subungual debris and tenderness to dorsal palpation. Hyperkeratotic lesion(s) submet head 1 left foot, submet head 1 right foot and submet head 4 right foot.  No erythema, no edema, no drainage, no flocculence.  Musculoskeletal: Normal muscle strength 5/5 to all lower extremity muscle groups bilaterally. No pain crepitus or joint limitation noted with ROM b/l. Lower extremity amputation(s): partial amputation of R hallux.  Neurological  Examination: Protective sensation intact 5/5 intact bilaterally with 10g monofilament b/l. Vibratory sensation intact b/l. Proprioception intact bilaterally.  Assessment: 1. Onychomycosis   2. Callus   3. History of amputation of hallux (HCC)   4. DM type 2 with diabetic peripheral neuropathy (HCC)     Plan: -Examined patient. -Continue diabetic foot care principles. -No new findings. No new orders. -Toenails 1-5 left, R 2nd toe, R 3rd toe, R 4th toe and R 5th toe debrided in length and girth without iatrogenic bleeding with sterile nail nipper and dremel.  -Callus(es) submet head 1 left foot, submet head 1 right foot and submet head 4 right foot pared utilizing sterile scalpel blade without complication or incident. Total number debrided =3. -Patient to continue diabetic shoes daily. She will be recasted by Pedorthist on today's visit. -Patient to report any pedal injuries to medical professional immediately. -Patient/POA to call should there be question/concern in the interim.  Return in about 10 weeks (around 10/20/2020).  [name], DPM

## 2020-09-19 ENCOUNTER — Other Ambulatory Visit: Payer: Self-pay | Admitting: Family Medicine

## 2020-09-19 DIAGNOSIS — Z1231 Encounter for screening mammogram for malignant neoplasm of breast: Secondary | ICD-10-CM

## 2020-10-16 ENCOUNTER — Other Ambulatory Visit: Payer: Self-pay

## 2020-10-16 ENCOUNTER — Ambulatory Visit
Admission: RE | Admit: 2020-10-16 | Discharge: 2020-10-16 | Disposition: A | Discharge status: Home / Self Care | Payer: BC Managed Care – PPO | Source: Ambulatory Visit | Attending: Family Medicine | Admitting: Family Medicine

## 2020-10-16 DIAGNOSIS — Z1231 Encounter for screening mammogram for malignant neoplasm of breast: Secondary | ICD-10-CM

## 2020-10-23 ENCOUNTER — Ambulatory Visit: Payer: BC Managed Care – PPO | Admitting: Podiatry

## 2020-10-27 ENCOUNTER — Ambulatory Visit: Payer: BC Managed Care – PPO | Admitting: Podiatry

## 2020-11-13 ENCOUNTER — Ambulatory Visit: Payer: BC Managed Care – PPO | Admitting: Podiatry

## 2020-12-19 ENCOUNTER — Other Ambulatory Visit: Payer: Self-pay

## 2020-12-19 ENCOUNTER — Encounter: Payer: Self-pay | Admitting: Podiatry

## 2020-12-19 ENCOUNTER — Ambulatory Visit (INDEPENDENT_AMBULATORY_CARE_PROVIDER_SITE_OTHER): Payer: BC Managed Care – PPO | Admitting: Podiatry

## 2020-12-19 DIAGNOSIS — E1142 Type 2 diabetes mellitus with diabetic polyneuropathy: Secondary | ICD-10-CM | POA: Diagnosis not present

## 2020-12-19 DIAGNOSIS — B351 Tinea unguium: Secondary | ICD-10-CM | POA: Diagnosis not present

## 2020-12-19 DIAGNOSIS — I129 Hypertensive chronic kidney disease with stage 1 through stage 4 chronic kidney disease, or unspecified chronic kidney disease: Secondary | ICD-10-CM | POA: Insufficient documentation

## 2020-12-19 DIAGNOSIS — Z89419 Acquired absence of unspecified great toe: Secondary | ICD-10-CM

## 2020-12-19 DIAGNOSIS — Z1211 Encounter for screening for malignant neoplasm of colon: Secondary | ICD-10-CM | POA: Insufficient documentation

## 2020-12-19 DIAGNOSIS — Z6841 Body Mass Index (BMI) 40.0 and over, adult: Secondary | ICD-10-CM | POA: Insufficient documentation

## 2020-12-19 DIAGNOSIS — L84 Corns and callosities: Secondary | ICD-10-CM | POA: Diagnosis not present

## 2020-12-19 DIAGNOSIS — Q828 Other specified congenital malformations of skin: Secondary | ICD-10-CM

## 2020-12-19 DIAGNOSIS — S98139A Complete traumatic amputation of one unspecified lesser toe, initial encounter: Secondary | ICD-10-CM | POA: Insufficient documentation

## 2020-12-22 NOTE — Progress Notes (Signed)
Subjective: Crystal Dudley presents today at risk foot care. Patient has h/o amputation of R hallux and painful callus(es) b/l feet and painful mycotic toenails b/l that are difficult to trim. Pain interferes with ambulation. Aggravating factors include wearing enclosed shoe gear. Pain is relieved with periodic professional debridement.   Crystal Raider, MD is patient's PCP. Last visit was September 15, 2020.  Past Medical History:  Diagnosis Date  . Diabetes mellitus without complication (HCC)    Type II     Current Outpatient Medications on File Prior to Visit  Medication Sig Dispense Refill  . aspirin 81 MG tablet Take 81 mg by mouth daily.    Marland Kitchen azelastine (ASTELIN) 0.1 % nasal spray USE 2 SPRAY(S) IN EACH NOSTRIL TWICE DAILY    . Cholecalciferol (VITAMIN D3) 5000 units CAPS Take 5,000 Units by mouth daily.    . Cranberry-Vitamin C (AZO CRANBERRY URINARY TRACT PO) Take 1 tablet by mouth daily.    . Dapagliflozin-metFORMIN HCl ER (XIGDUO XR) 04-999 MG TB24 Take by mouth.    . gabapentin (NEURONTIN) 300 MG capsule Take 1 capsule (300 mg total) by mouth 3 (three) times daily. Start with nightly only. Can slowly increase to three times a day as directed. 30 capsule 3  . hydrochlorothiazide (HYDRODIURIL) 25 MG tablet Take 25 mg by mouth daily.    . Insulin Glargine, 1 Unit Dial, (TOUJEO SOLOSTAR) 300 UNIT/ML SOPN Inject into the skin.    . INVOKAMET XR [phone] MG TB24 Take 2 tablets by mouth daily.     Marland Kitchen lisinopril (PRINIVIL,ZESTRIL) 40 MG tablet Take 40 mg by mouth daily.     Marland Kitchen loratadine (CLARITIN) 10 MG tablet Take 10 mg by mouth daily.    . Magnesium 250 MG TABS Take 250 mg by mouth daily.    . mupirocin ointment (BACTROBAN) 2 % Apply to left foot ulcer once daily and cover with dressing. 30 g 1  . ONETOUCH VERIO test strip     . oxyCODONE-acetaminophen (ROXICET) 5-325 MG tablet Take 1 tablet by mouth every 4 (four) hours as needed for severe pain. 30 tablet 0  . pioglitazone  (ACTOS) 15 MG tablet Take 15 mg by mouth daily.     . simvastatin (ZOCOR) 40 MG tablet Take 40 mg by mouth daily.     . TRESIBA FLEXTOUCH 200 UNIT/ML SOPN Inject 80 Units into the skin daily.     . TRULICITY 1.5 MG/0.5ML SOPN Inject 1.5 mg into the skin once a week.     . TRULICITY 3 MG/0.5ML SOPN INJECT 3 MG ONCE A WEEK FOR DIABETES DOSE INCREASE    . vitamin B-12 (CYANOCOBALAMIN) 1000 MCG tablet Take 1 tablet once a day    . XIGDUO XR 04-999 MG TB24 TAKE 2 TABLETS BY MOUTH ONCE DAILY BEFORE BREAKFAST     No current facility-administered medications on file prior to visit.     Allergies  Allergen Reactions  . Codeine Nausea And Vomiting    Objective: Crystal Dudley is a pleasant 49 y.o. female, obese, in NAD. AAO x 3.  There were no vitals filed for this visit.  Vascular Examination: Neurovascular status unchanged b/l lower extremities. Capillary refill time to digits immediate b/l. Palpable DP pulses b/l. Palpable PT pulses b/l. Pedal hair present b/l. Skin temperature gradient within normal limits b/l. No pain with calf compression b/l. No edema noted b/l.  Dermatological Examination: Pedal skin with normal turgor, texture and tone bilaterally. No open wounds bilaterally. No interdigital  macerations bilaterally. Toenails 1-5 left, R 2nd toe, R 3rd toe, R 4th toe and R 5th toe elongated, discolored, dystrophic, thickened, and crumbly with subungual debris and tenderness to dorsal palpation. Hyperkeratotic lesion(s) submet head 1 left foot, submet head 1 right foot and submet head 4 right foot.  No erythema, no edema, no drainage, no flocculence.  Musculoskeletal: Normal muscle strength 5/5 to all lower extremity muscle groups bilaterally. No pain crepitus or joint limitation noted with ROM b/l. Lower extremity amputation(s): partial amputation of R hallux.  Neurological Examination: Protective sensation intact 5/5 intact bilaterally with 10g monofilament b/l. Vibratory sensation  intact b/l. Proprioception intact bilaterally.  Assessment: 1. Onychomycosis   2. Callus   3. History of amputation of hallux (HCC)   4. Porokeratosis   5. DM type 2 with diabetic peripheral neuropathy (HCC)     Plan: -Examined patient. -Continue diabetic foot care principles. -No new findings. No new orders. -Toenails 1-5 left, R 2nd toe, R 3rd toe, R 4th toe and R 5th toe debrided in length and girth without iatrogenic bleeding with sterile nail nipper and dremel.  -Callus(es) submet head 1 left foot, submet head 1 right foot and submet head 4 right foot pared utilizing sterile scalpel blade without complication or incident. Total number debrided =3. -She did see Pedorthist today for more modification of her diabetic insoles to offload lesions. -Patient to report any pedal injuries to medical professional immediately. -Patient/POA to call should there be question/concern in the interim.  Return in about 10 weeks (around 02/27/2021).  [name], DPM

## 2021-02-27 ENCOUNTER — Encounter: Payer: Self-pay | Admitting: Podiatry

## 2021-02-27 ENCOUNTER — Other Ambulatory Visit: Payer: Self-pay

## 2021-02-27 ENCOUNTER — Other Ambulatory Visit: Payer: BC Managed Care – PPO

## 2021-02-27 ENCOUNTER — Ambulatory Visit (INDEPENDENT_AMBULATORY_CARE_PROVIDER_SITE_OTHER): Payer: BC Managed Care – PPO | Admitting: Podiatry

## 2021-02-27 DIAGNOSIS — Z8371 Family history of colonic polyps: Secondary | ICD-10-CM | POA: Insufficient documentation

## 2021-02-27 DIAGNOSIS — L84 Corns and callosities: Secondary | ICD-10-CM

## 2021-02-27 DIAGNOSIS — E782 Mixed hyperlipidemia: Secondary | ICD-10-CM | POA: Insufficient documentation

## 2021-02-27 DIAGNOSIS — B351 Tinea unguium: Secondary | ICD-10-CM

## 2021-02-27 DIAGNOSIS — Z89419 Acquired absence of unspecified great toe: Secondary | ICD-10-CM

## 2021-02-27 DIAGNOSIS — Z83719 Family history of colon polyps, unspecified: Secondary | ICD-10-CM | POA: Insufficient documentation

## 2021-02-27 DIAGNOSIS — E1142 Type 2 diabetes mellitus with diabetic polyneuropathy: Secondary | ICD-10-CM

## 2021-02-27 DIAGNOSIS — E11621 Type 2 diabetes mellitus with foot ulcer: Secondary | ICD-10-CM | POA: Insufficient documentation

## 2021-02-27 DIAGNOSIS — D72829 Elevated white blood cell count, unspecified: Secondary | ICD-10-CM | POA: Insufficient documentation

## 2021-03-04 NOTE — Progress Notes (Signed)
Subjective: Crystal Dudley presents today at risk foot care. Patient has h/o diabetes with amputation of R hallux and painful callus(es) b/l feet and painful mycotic toenails b/l that are difficult to trim. Pain interferes with ambulation. Aggravating factors include wearing enclosed shoe gear. Pain is relieved with periodic professional debridement.   [name], MD is patient's PCP. Last visit was 01/18/2021.   Allergies  Allergen Reactions  . Codeine Nausea And Vomiting    Objective: Crystal Dudley is a pleasant 50 y.o. female, obese, in NAD. AAO x 3.  There were no vitals filed for this visit.  Vascular Examination: Neurovascular status unchanged b/l lower extremities. Capillary refill time to digits immediate b/l. Palpable DP pulses b/l. Palpable PT pulses b/l. Pedal hair present b/l. Skin temperature gradient within normal limits b/l. No pain with calf compression b/l. No edema noted b/l.  Dermatological Examination: Pedal skin with normal turgor, texture and tone bilaterally. No open wounds bilaterally. No interdigital macerations bilaterally. Toenails 1-5 left, R 2nd toe, R 3rd toe, R 4th toe and R 5th toe elongated, discolored, dystrophic, thickened, and crumbly with subungual debris and tenderness to dorsal palpation. Hyperkeratotic lesion(s) submet head 1 left foot, submet head 1 right foot and submet head 4 right foot.  No erythema, no edema, no drainage, no flocculence.   She is starting to get flexion of right 3rd digit and developing a distal corn.  Musculoskeletal: Normal muscle strength 5/5 to all lower extremity muscle groups bilaterally. No pain crepitus or joint limitation noted with ROM b/l. Lower extremity amputation(s): partial amputation of R hallux.  Neurological Examination: Protective sensation intact 5/5 intact bilaterally with 10g monofilament b/l. Vibratory sensation intact b/l. Proprioception intact bilaterally.  Assessment: 1. Onychomycosis    2. Callus   3. Pre-ulcerative corn or callous   4. History of amputation of hallux (HCC)   5. DM type 2 with diabetic peripheral neuropathy (HCC)     Plan: -Examined patient. -Will refer back to Dr.Price for evaluation of need for flexor tenotomy to prevent distal tip ulceration right 3rd digit. -Continue diabetic foot care principles. -Toenails 1-5 left, R 2nd toe, R 3rd toe, R 4th toe and R 5th toe debrided in length and girth without iatrogenic bleeding with sterile nail nipper and dremel.  -Callus(es) submet head 1 left foot, submet head 1 right foot and submet head 4 right foot and preulcerative corn right 3rd digit pared utilizing sterile scalpel blade without complication or incident. Total number debrided =4. -Patient to report any pedal injuries to medical professional immediately. -Patient/POA to call should there be question/concern in the interim.  Return in about 3 months (around 05/30/2021).  [name], DPM

## 2021-03-20 ENCOUNTER — Ambulatory Visit (INDEPENDENT_AMBULATORY_CARE_PROVIDER_SITE_OTHER): Payer: BC Managed Care – PPO | Admitting: Podiatry

## 2021-03-20 ENCOUNTER — Other Ambulatory Visit: Payer: Self-pay

## 2021-03-20 ENCOUNTER — Ambulatory Visit (INDEPENDENT_AMBULATORY_CARE_PROVIDER_SITE_OTHER): Payer: BC Managed Care – PPO

## 2021-03-20 DIAGNOSIS — M898X7 Other specified disorders of bone, ankle and foot: Secondary | ICD-10-CM

## 2021-03-20 DIAGNOSIS — M2041 Other hammer toe(s) (acquired), right foot: Secondary | ICD-10-CM

## 2021-03-20 DIAGNOSIS — M21961 Unspecified acquired deformity of right lower leg: Secondary | ICD-10-CM | POA: Diagnosis not present

## 2021-04-02 ENCOUNTER — Telehealth: Payer: Self-pay | Admitting: Urology

## 2021-04-02 NOTE — Telephone Encounter (Signed)
DOS - 04/25/21  METATARSAL OSTEOTOMY 3RD RIGHT --- 28308 HAMMERTOE REPAIR 3RD AND 4TH RIGHT --- 29021  [employer] EFFECTIVE DATE - 12/23/2018  PLAN DEDUCTIBLE - $3,500.00 W/ $3,036.00 REMAINING OUT OF POCKET - $6,850.00 W/ $4,745.00 REMAINING COINSURANCE -  0%  COPAY - $0.00   SPOKE WITH FELICIA WITH [employer] AND SHE STATED THAT NO PRIOR AUTH IS REQUIRED FOR CPT CODES 11552 AND [phone].  REF #[id number]

## 2021-04-20 ENCOUNTER — Other Ambulatory Visit: Payer: BC Managed Care – PPO

## 2021-04-25 ENCOUNTER — Encounter: Payer: Self-pay | Admitting: Podiatry

## 2021-04-25 ENCOUNTER — Other Ambulatory Visit: Payer: Self-pay | Admitting: Podiatry

## 2021-04-25 DIAGNOSIS — M21541 Acquired clubfoot, right foot: Secondary | ICD-10-CM | POA: Diagnosis not present

## 2021-04-25 DIAGNOSIS — M2041 Other hammer toe(s) (acquired), right foot: Secondary | ICD-10-CM | POA: Diagnosis not present

## 2021-04-25 MED ORDER — HYDROCODONE-ACETAMINOPHEN 5-325 MG PO TABS: 1.0000 | ORAL_TABLET | ORAL | 0 refills | Status: AC | PRN

## 2021-04-25 MED ORDER — CEPHALEXIN 500 MG PO CAPS: 500.0000 mg | ORAL_CAPSULE | Freq: Two times a day (BID) | ORAL | 0 refills | Status: DC

## 2021-04-25 MED ORDER — ONDANSETRON HCL 4 MG PO TABS: 4.0000 mg | ORAL_TABLET | Freq: Three times a day (TID) | ORAL | 0 refills | Status: DC | PRN

## 2021-04-25 NOTE — Progress Notes (Signed)
Rx sent to pharmacy for outpatient surgery. °

## 2021-04-27 NOTE — Progress Notes (Signed)
  Subjective:  Patient ID: Crystal Dudley, female    DOB: 11/03/1971,  MRN: [id number]  Chief Complaint  Patient presents with  . Nail Problem    Thick painful toenails  . Callouses     evaluation for flexor tenotomy of right 3rd digit for preulcerative corn.  . Diabetes    A1C  6.6    50 y.o. female presents with the above complaint. History confirmed with patient.   Objective:  Physical Exam: warm, good capillary refill, no trophic changes or ulcerative lesions, normal DP and PT pulses and normal sensory exam. Right Foot: Reducible contractures of right third fourth toes.  Preulcerative lesion around the submetatarsal 3  No images are attached to the encounter.  Radiographs: X-ray of the right foot: no fracture, dislocation, swelling or degenerative changes noted and Lesser digital contractures plantarflexed third metatarsal Assessment:   1. Hammertoe of right foot   2. Metatarsal deformity, right   3. Pain in metatarsus of right foot      Plan:  Patient was evaluated and treated and all questions answered.  Hammertoes right third fourth toes, metatarsal deformity -X-ray reviewed with patient -Patient has failed all conservative therapy and wishes to proceed with surgical intervention. All risks, benefits, and alternatives discussed with patient. No guarantees given. Consent reviewed and signed by patient. 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

## 2021-05-01 ENCOUNTER — Ambulatory Visit (INDEPENDENT_AMBULATORY_CARE_PROVIDER_SITE_OTHER): Payer: BC Managed Care – PPO

## 2021-05-01 ENCOUNTER — Ambulatory Visit (INDEPENDENT_AMBULATORY_CARE_PROVIDER_SITE_OTHER): Payer: BC Managed Care – PPO | Admitting: Podiatry

## 2021-05-01 ENCOUNTER — Other Ambulatory Visit: Payer: Self-pay

## 2021-05-01 DIAGNOSIS — Z9889 Other specified postprocedural states: Secondary | ICD-10-CM

## 2021-05-01 DIAGNOSIS — M2041 Other hammer toe(s) (acquired), right foot: Secondary | ICD-10-CM

## 2021-05-01 DIAGNOSIS — M21961 Unspecified acquired deformity of right lower leg: Secondary | ICD-10-CM

## 2021-05-01 NOTE — Progress Notes (Signed)
  Subjective:  Patient ID: Margy Clarks, female    DOB: [birth year],  MRN: [id number]  Chief Complaint  Patient presents with  . Routine Post Op    POV #1 RIGHT FOOT FLEXOR TENOTOMY 3RD AND 4TH TOES, FLOATING METATARSAL OSTEOTOMY RIGHT 3RD METATARSAL. Doing well. Denies f/c/n/v. Manageable pain lvls.     DOS: 04/25/21 Procedure: Metatarsal osteotomy 3rd met, flexor tenotomy 3rd/4th toes right  50 y.o. female presents with the above complaint. History confirmed with patient. Denies post-op issues states her foot hurts less than it did prior to surgery.  Objective:  Physical Exam: tenderness at the surgical site, local edema noted and calf supple, nontender. Incision: healing well, no significant drainage, no dehiscence, no significant erythema  No images are attached to the encounter.  Radiographs: X-ray of the right foot:consitent with post-op state. Elevation of capital fragment noted.   Assessment:   1. Status post surgery     Plan:  Patient was evaluated and treated and all questions answered.  Post-operative State -XR reviewed with patient -Ok to start showering at this time. Advised they cannot soak. -Dressing applied consisting of povidone and band-aid -WBAT in Surgical shoe  -Pre-ulcerative callus does appear to be improving 2/2 elevation of the 3rd capital fragment -Ok to resume driving  No follow-ups on file.

## 2021-05-15 ENCOUNTER — Other Ambulatory Visit: Payer: Self-pay

## 2021-05-15 ENCOUNTER — Ambulatory Visit (INDEPENDENT_AMBULATORY_CARE_PROVIDER_SITE_OTHER): Payer: BC Managed Care – PPO | Admitting: Podiatry

## 2021-05-15 DIAGNOSIS — M2041 Other hammer toe(s) (acquired), right foot: Secondary | ICD-10-CM

## 2021-05-15 DIAGNOSIS — Z9889 Other specified postprocedural states: Secondary | ICD-10-CM

## 2021-05-15 NOTE — Progress Notes (Signed)
  Subjective:  Patient ID: Crystal Dudley, female    DOB: August 09, 1971,  MRN: [id number]  Chief Complaint  Patient presents with  . Routine Post Op     POV #2 RIGHT FOOT FLEXOR TENOTOMY 3RD AND 4TH TOES, FLOATING METATARSAL OSTEOTOMY RIGHT 3RD METATARSAL Denies f/c/n/v/other concerns.     DOS: 04/25/21 Procedure: Metatarsal osteotomy 3rd met, flexor tenotomy 3rd/4th toes right  50 y.o. female presents with the above complaint. History confirmed with patient.   Objective:  Physical Exam: tenderness at the surgical site, local edema noted and calf supple, nontender. Incision: healing well, no significant drainage, no dehiscence, no significant erythema Assessment:   1. Status post surgery   2. Hammertoe of right foot   3. Post-operative state    Plan:  Patient was evaluated and treated and all questions answered.  Post-operative State -Sutures removed -WBAT in Surgical shoe  -F/u in 2 weeks for repeat XRs   No follow-ups on file.

## 2021-05-29 ENCOUNTER — Encounter: Payer: BC Managed Care – PPO | Admitting: Podiatry

## 2021-06-01 ENCOUNTER — Ambulatory Visit (INDEPENDENT_AMBULATORY_CARE_PROVIDER_SITE_OTHER): Payer: BC Managed Care – PPO | Admitting: Podiatry

## 2021-06-01 ENCOUNTER — Ambulatory Visit (INDEPENDENT_AMBULATORY_CARE_PROVIDER_SITE_OTHER): Payer: BC Managed Care – PPO

## 2021-06-01 ENCOUNTER — Other Ambulatory Visit: Payer: Self-pay

## 2021-06-01 DIAGNOSIS — M2041 Other hammer toe(s) (acquired), right foot: Secondary | ICD-10-CM

## 2021-06-22 NOTE — Progress Notes (Signed)
  Subjective:  Patient ID: Crystal Dudley, female    DOB: 1971/05/06,  MRN: [id number]  Chief Complaint  Patient presents with   Routine Post Op    POV#3 Denies fever/chills/nausea/vomiting. Pt states no pain and no concerns/questions.    DOS: 04/25/21 Procedure: Metatarsal osteotomy 3rd met, flexor tenotomy 3rd/4th toes right  50 y.o. female presents with the above complaint. History confirmed with patient.   Objective:  Physical Exam: no tenderness at the surgical site, no edema noted, and calf supple, nontender. Incision: Well-healed Assessment:   1. Hammertoe of right foot    Plan:  Patient was evaluated and treated and all questions answered.  Post-operative State -Repeat x-rays show healing of the osteotomy.  No pain on exam the ulcer appears to have resolved.  At this point we will release her to follow-up only as needed for routine foot care  Return in about 1 month (around 07/01/2021).

## 2021-07-06 ENCOUNTER — Encounter: Payer: Self-pay | Admitting: Podiatry

## 2021-07-06 ENCOUNTER — Ambulatory Visit (INDEPENDENT_AMBULATORY_CARE_PROVIDER_SITE_OTHER): Payer: BC Managed Care – PPO | Admitting: Podiatry

## 2021-07-06 ENCOUNTER — Other Ambulatory Visit: Payer: Self-pay

## 2021-07-06 DIAGNOSIS — Z89419 Acquired absence of unspecified great toe: Secondary | ICD-10-CM | POA: Diagnosis not present

## 2021-07-06 DIAGNOSIS — L84 Corns and callosities: Secondary | ICD-10-CM | POA: Diagnosis not present

## 2021-07-06 DIAGNOSIS — B351 Tinea unguium: Secondary | ICD-10-CM | POA: Diagnosis not present

## 2021-07-06 DIAGNOSIS — E1142 Type 2 diabetes mellitus with diabetic polyneuropathy: Secondary | ICD-10-CM

## 2021-07-08 NOTE — Progress Notes (Signed)
Subjective: Crystal Dudley is a pleasant 50 y.o. female patient seen today for at-risk foot care. She also has elongated,  thick toenails that are difficult to trim. Pain interferes with ambulation. Aggravating factors include wearing enclosed shoe gear. Pain is relieved with periodic professional debridement.  Patient states blood glucose was 125 mg/dl today.   She had hammertoe surgery performed on 04/25/2021 by Dr. Samuella Cota. She voices no new pedal problems on today's visit.  PCP is [name], MD. Last visit was: September, 2021.  Allergies  Allergen Reactions   Codeine Nausea And Vomiting    Objective: Physical Exam  General: Crystal Dudley is a pleasant 50 y.o. Caucasian female, morbidly obese in NAD. AAO x 3.   Vascular:  Capillary refill time to digits immediate b/l. Palpable pedal pulses b/l LE. Pedal hair present. Lower extremity skin temperature gradient within normal limits. No pain with calf compression b/l. No edema noted b/l lower extremities.  Dermatological:  Pedal skin with normal turgor, texture and tone b/l lower extremities. No open wounds b/l lower extremities. No interdigital macerations b/l lower extremities. Toenails 2-5 bilaterally and L hallux elongated, discolored, dystrophic, thickened, and crumbly with subungual debris and tenderness to dorsal palpation. Hyperkeratotic lesion(s) submet head 1 left foot, submet head 1 right foot, and submet head 4 right foot.  No erythema, no edema, no drainage, no fluctuance.  Resolvied lesion distal tip right 3rd digit.  Musculoskeletal:  Normal muscle strength 5/5 to all lower extremity muscle groups bilaterally. No pain crepitus or joint limitation noted with ROM b/l. Lower extremity amputation(s): partial amputation of R hallux.  Neurological:  Protective sensation intact 5/5 intact bilaterally with 10g monofilament b/l. Vibratory sensation intact b/l.  Assessment and Plan:  1. Onychomycosis   2. Callus   3.  History of amputation of hallux (HCC)   4. DM type 2 with diabetic peripheral neuropathy (HCC)     -Examined patient. -Continue diabetic foot care principles. -Patient to continue soft, supportive shoe gear daily. -Toenails 2-5 bilaterally and L hallux debrided in length and girth without iatrogenic bleeding with sterile nail nipper and dremel.  -Callus(es) submet head 1 left foot, submet head 1 right foot, and submet head 4 right foot pared utilizing sterile scalpel blade without complication or incident. Total number debrided =3. -Patient to report any pedal injuries to medical professional immediately. -Patient/POA to call should there be question/concern in the interim.  Return in about 3 months (around 10/06/2021).  [name], DPM

## 2021-08-23 DIAGNOSIS — I10 Essential (primary) hypertension: Secondary | ICD-10-CM | POA: Diagnosis not present

## 2021-08-23 DIAGNOSIS — E782 Mixed hyperlipidemia: Secondary | ICD-10-CM | POA: Diagnosis not present

## 2021-08-23 DIAGNOSIS — Z794 Long term (current) use of insulin: Secondary | ICD-10-CM | POA: Diagnosis not present

## 2021-08-23 DIAGNOSIS — E1165 Type 2 diabetes mellitus with hyperglycemia: Secondary | ICD-10-CM | POA: Diagnosis not present

## 2021-08-28 DIAGNOSIS — R300 Dysuria: Secondary | ICD-10-CM | POA: Diagnosis not present

## 2021-08-28 DIAGNOSIS — Z794 Long term (current) use of insulin: Secondary | ICD-10-CM | POA: Diagnosis not present

## 2021-08-28 DIAGNOSIS — E1142 Type 2 diabetes mellitus with diabetic polyneuropathy: Secondary | ICD-10-CM | POA: Diagnosis not present

## 2021-08-28 DIAGNOSIS — N390 Urinary tract infection, site not specified: Secondary | ICD-10-CM | POA: Diagnosis not present

## 2021-08-28 DIAGNOSIS — E1165 Type 2 diabetes mellitus with hyperglycemia: Secondary | ICD-10-CM | POA: Diagnosis not present

## 2021-09-27 DIAGNOSIS — Z0000 Encounter for general adult medical examination without abnormal findings: Secondary | ICD-10-CM | POA: Diagnosis not present

## 2021-09-27 DIAGNOSIS — E1121 Type 2 diabetes mellitus with diabetic nephropathy: Secondary | ICD-10-CM | POA: Diagnosis not present

## 2021-09-27 DIAGNOSIS — N181 Chronic kidney disease, stage 1: Secondary | ICD-10-CM | POA: Diagnosis not present

## 2021-09-27 DIAGNOSIS — I129 Hypertensive chronic kidney disease with stage 1 through stage 4 chronic kidney disease, or unspecified chronic kidney disease: Secondary | ICD-10-CM | POA: Diagnosis not present

## 2021-10-12 ENCOUNTER — Ambulatory Visit (INDEPENDENT_AMBULATORY_CARE_PROVIDER_SITE_OTHER): Payer: BC Managed Care – PPO | Admitting: Podiatry

## 2021-10-12 ENCOUNTER — Other Ambulatory Visit: Payer: Self-pay

## 2021-10-12 DIAGNOSIS — Z89419 Acquired absence of unspecified great toe: Secondary | ICD-10-CM | POA: Diagnosis not present

## 2021-10-12 DIAGNOSIS — L84 Corns and callosities: Secondary | ICD-10-CM

## 2021-10-12 DIAGNOSIS — B351 Tinea unguium: Secondary | ICD-10-CM

## 2021-10-12 DIAGNOSIS — E1142 Type 2 diabetes mellitus with diabetic polyneuropathy: Secondary | ICD-10-CM | POA: Diagnosis not present

## 2021-10-19 ENCOUNTER — Encounter: Payer: Self-pay | Admitting: Podiatry

## 2021-10-19 NOTE — Progress Notes (Signed)
  Subjective:  Patient ID: Crystal Dudley, female    DOB: 10/05/1971,  MRN: [id number]  Crystal Dudley presents to clinic today for at risk foot care. Patient has h/o amputation of partial amputation of R hallux and callus(es) bilateral feet and painful thick toenails that are difficult to trim. Painful toenails interfere with ambulation. Aggravating factors include wearing enclosed shoe gear. Pain is relieved with periodic professional debridement. Painful calluses are aggravated when weightbearing with and without shoegear. Pain is relieved with periodic professional debridement.  She voices no new pedal concerns on today's visit.  PCP is [name], MD , and last visit was December, 2021.  Allergies  Allergen Reactions   Codeine Nausea And Vomiting    Review of Systems: Negative except as noted in the HPI. Objective:   Constitutional Crystal Dudley is a pleasant 50 y.o. Caucasian female, obese in NAD. AAO x 3.   Vascular Capillary refill time to digits immediate b/l. Palpable DP pulse(s) b/l lower extremities Palpable PT pulse(s) b/l lower extremities Pedal hair present. Lower extremity skin temperature gradient within normal limits. No pain with calf compression b/l. No edema noted b/l LE. No cyanosis or clubbing noted.  Neurologic Normal speech. Oriented to person, place, and time. Protective sensation intact 5/5 intact bilaterally with 10g monofilament b/l. Vibratory sensation intact b/l.  Dermatologic Pedal skin is warm and supple b/l LE. No open wounds b/l LE. No interdigital macerations noted b/l LE. Toenails 2-5 bilaterally and L hallux elongated, discolored, dystrophic, thickened, and crumbly with subungual debris and tenderness to dorsal palpation. Hyperkeratotic lesion(s) submet head 1 right foot, submet head 3 left foot, and submet head 3 right foot.  No erythema, no edema, no drainage, no fluctuance.  Orthopedic: Normal muscle strength 5/5 to all lower extremity muscle  groups bilaterally. Lower extremity amputation(s): partial amputation of R hallux.   Radiographs: None Assessment:   1. Onychomycosis   2. Callus   3. History of amputation of hallux (HCC)   4. DM type 2 with diabetic peripheral neuropathy (HCC)    Plan:  Patient was evaluated and treated and all questions answered. Consent given for treatment as described below: -No new findings. No new orders. -Continue diabetic foot care principles: inspect feet daily, monitor glucose as recommended by PCP and/or Endocrinologist, and follow prescribed diet per PCP, Endocrinologist and/or dietician. -Toenails 2-5 bilaterally and L hallux debrided in length and girth without iatrogenic bleeding with sterile nail nipper and dremel.  -Callus(es) submet head 1 right foot and submet head 3 b/l pared utilizing sterile scalpel blade without complication or incident. Total number debrided =3. -Patient/POA to call should there be question/concern in the interim.  Return in about 3 months (around 01/12/2022).  [name], DPM

## 2021-11-08 DIAGNOSIS — J029 Acute pharyngitis, unspecified: Secondary | ICD-10-CM | POA: Diagnosis not present

## 2021-11-08 DIAGNOSIS — Z03818 Encounter for observation for suspected exposure to other biological agents ruled out: Secondary | ICD-10-CM | POA: Diagnosis not present

## 2021-11-08 DIAGNOSIS — R509 Fever, unspecified: Secondary | ICD-10-CM | POA: Diagnosis not present

## 2021-12-18 DIAGNOSIS — E1142 Type 2 diabetes mellitus with diabetic polyneuropathy: Secondary | ICD-10-CM | POA: Diagnosis not present

## 2021-12-18 DIAGNOSIS — E559 Vitamin D deficiency, unspecified: Secondary | ICD-10-CM | POA: Diagnosis not present

## 2021-12-18 DIAGNOSIS — Z794 Long term (current) use of insulin: Secondary | ICD-10-CM | POA: Diagnosis not present

## 2021-12-18 DIAGNOSIS — E785 Hyperlipidemia, unspecified: Secondary | ICD-10-CM | POA: Diagnosis not present

## 2021-12-18 DIAGNOSIS — E113293 Type 2 diabetes mellitus with mild nonproliferative diabetic retinopathy without macular edema, bilateral: Secondary | ICD-10-CM | POA: Diagnosis not present

## 2021-12-18 DIAGNOSIS — E1165 Type 2 diabetes mellitus with hyperglycemia: Secondary | ICD-10-CM | POA: Diagnosis not present

## 2021-12-18 DIAGNOSIS — E538 Deficiency of other specified B group vitamins: Secondary | ICD-10-CM | POA: Diagnosis not present

## 2021-12-18 DIAGNOSIS — I10 Essential (primary) hypertension: Secondary | ICD-10-CM | POA: Diagnosis not present

## 2021-12-18 DIAGNOSIS — E1169 Type 2 diabetes mellitus with other specified complication: Secondary | ICD-10-CM | POA: Diagnosis not present

## 2021-12-21 DIAGNOSIS — E113293 Type 2 diabetes mellitus with mild nonproliferative diabetic retinopathy without macular edema, bilateral: Secondary | ICD-10-CM | POA: Diagnosis not present

## 2021-12-21 DIAGNOSIS — E1165 Type 2 diabetes mellitus with hyperglycemia: Secondary | ICD-10-CM | POA: Diagnosis not present

## 2021-12-21 DIAGNOSIS — E1142 Type 2 diabetes mellitus with diabetic polyneuropathy: Secondary | ICD-10-CM | POA: Diagnosis not present

## 2021-12-21 DIAGNOSIS — E1169 Type 2 diabetes mellitus with other specified complication: Secondary | ICD-10-CM | POA: Diagnosis not present

## 2022-01-23 ENCOUNTER — Other Ambulatory Visit: Payer: Self-pay

## 2022-01-23 ENCOUNTER — Ambulatory Visit (INDEPENDENT_AMBULATORY_CARE_PROVIDER_SITE_OTHER): Payer: BC Managed Care – PPO | Admitting: Podiatry

## 2022-01-23 DIAGNOSIS — E1142 Type 2 diabetes mellitus with diabetic polyneuropathy: Secondary | ICD-10-CM | POA: Diagnosis not present

## 2022-01-23 DIAGNOSIS — Z0189 Encounter for other specified special examinations: Secondary | ICD-10-CM

## 2022-01-23 DIAGNOSIS — B351 Tinea unguium: Secondary | ICD-10-CM | POA: Diagnosis not present

## 2022-01-23 DIAGNOSIS — L84 Corns and callosities: Secondary | ICD-10-CM | POA: Diagnosis not present

## 2022-01-23 DIAGNOSIS — E119 Type 2 diabetes mellitus without complications: Secondary | ICD-10-CM

## 2022-01-23 DIAGNOSIS — Z89419 Acquired absence of unspecified great toe: Secondary | ICD-10-CM

## 2022-01-23 NOTE — Progress Notes (Signed)
ANNUAL DIABETIC FOOT EXAM  Subjective: Crystal Dudley presents today for for annual diabetic foot examination, at risk foot care. Patient has h/o amputation of partial amputation of R hallux, and callus(es) bilaterally and painful thick toenails that are difficult to trim. Painful toenails interfere with ambulation. Aggravating factors include wearing enclosed shoe gear. Pain is relieved with periodic professional debridement. Painful calluses are aggravated when weightbearing with and without shoegear. Pain is relieved with periodic professional debridement..  Patient relates 16 year h/o diabetes.  Patient has h/o amputation of right great toe.  Patient denies any numbness, tingling, burning, or pins/needle sensation in feet.  Patient has been diagnosed with neuropathy and it is managed with gabapentin.  Last A1c was 7.9%, September, 2022.  Risk factors: diabetic neuropathy, history of amputation, CKD, hyperlipidemia, HTN.  Mayra Neer, MD is patient's PCP. Last visit was 09/27/2021.  Past Medical History:  Diagnosis Date   Diabetes mellitus without complication ([place])    Type II   Patient Active Problem List   Diagnosis Date Noted   Diabetic foot ulcer ([place]) 02/27/2021   Family history of colonic polyps 02/27/2021   Leukocytosis 02/27/2021   Mixed hyperlipidemia 02/27/2021   Amputation of toe ([place]) 12/19/2020   Body mass index (BMI) 40.0-44.9, adult ([place]) 12/19/2020   Chronic kidney disease due to hypertension 12/19/2020   Colon cancer screening 12/19/2020   Vitamin B12 deficiency 05/20/2019   History of partial amputation of toe of right foot ([place]) 11/01/2017   Severe obesity ([place]) 04/14/2017   Vitamin D deficiency 04/14/2017   Diabetes ([place]) 02/24/2014   Unspecified essential hypertension 02/24/2014   Dyslipidemia 02/24/2014   Past Surgical History:  Procedure Laterality Date   AMPUTATION TOE Right 09/05/2017   Procedure: AMPUTATION TOE;  Surgeon: Evelina Bucy, DPM;  Location: [facility];  Service: Podiatry;  Laterality: Right;   Current Outpatient Medications on File Prior to Visit  Medication Sig Dispense Refill   amoxicillin (AMOXIL) 500 MG capsule Take as directed for one week (started 02/06/21 after root canal)     amoxicillin (AMOXIL) 500 MG capsule Take by mouth.     amoxicillin-clavulanate (AUGMENTIN) 875-125 MG tablet Take 1 tablet by mouth 2 (two) times daily.     aspirin 81 MG EC tablet 1 tablet     azelastine (ASTELIN) 0.1 % nasal spray USE 2 SPRAY(S) IN EACH NOSTRIL TWICE DAILY     BINAXNOW COVID-19 AG HOME TEST KIT See admin instructions.     cefTRIAXone (ROCEPHIN) 1 g injection Inject into the muscle.     cephALEXin (KEFLEX) 500 MG capsule Take 1 capsule (500 mg total) by mouth 2 (two) times daily. 14 capsule 0   chlorhexidine (PERIDEX) 0.12 % solution 15 mLs 2 (two) times daily.     Cholecalciferol (VITAMIN D3) 5000 units CAPS Take 5,000 Units by mouth daily.     Cranberry-Vitamin C (AZO CRANBERRY URINARY TRACT PO) Take 1 tablet by mouth daily.     Cranberry-Vitamin C-Probiotic (AZO CRANBERRY) 250-30 MG TABS      Dapagliflozin-metFORMIN HCl ER (XIGDUO XR) 04-999 MG TB24 Take by mouth.     dexamethasone (DECADRON) 4 MG tablet Take by mouth.     gabapentin (NEURONTIN) 300 MG capsule Take 1 capsule (300 mg total) by mouth 3 (three) times daily. Start with nightly only. Can slowly increase to three times a day as directed. 30 capsule 3   hydrochlorothiazide (HYDRODIURIL) 25 MG tablet Take 25 mg by mouth daily.  HYDROcodone-acetaminophen (NORCO/VICODIN) 5-325 MG tablet Take 1 tablet by mouth every 4 (four) hours as needed for moderate pain. 20 tablet 0   INVOKAMET XR [id number] MG TB24 Take 2 tablets by mouth daily.      ketoconazole (NIZORAL) 2 % cream 1 application to affected area     lisinopril (PRINIVIL,ZESTRIL) 40 MG tablet Take 40 mg by mouth daily.      loratadine (CLARITIN) 10 MG tablet Take 10 mg by mouth daily.     Magnesium 250  MG TABS Take 250 mg by mouth daily.     metFORMIN (GLUCOPHAGE-XR) 500 MG 24 hr tablet Take by mouth.     ondansetron (ZOFRAN) 4 MG tablet Take 1 tablet (4 mg total) by mouth every 8 (eight) hours as needed for nausea or vomiting. 20 tablet 0   ONETOUCH VERIO test strip      oxyCODONE-acetaminophen (ROXICET) 5-325 MG tablet Take 1 tablet by mouth every 4 (four) hours as needed for severe pain. 30 tablet 0   pioglitazone (ACTOS) 15 MG tablet Take 15 mg by mouth daily.      polyethylene glycol-electrolytes (NULYTELY) 420 g solution Take by mouth as directed.     pseudoephedrine (SUDAFED) 30 MG tablet 2 tablets as needed     simvastatin (ZOCOR) 40 MG tablet Take 40 mg by mouth daily.      TOUJEO MAX SOLOSTAR 300 UNIT/ML Solostar Pen SMARTSIG:90 Unit(s) SUB-Q Every Morning     TRESIBA FLEXTOUCH 200 UNIT/ML SOPN Inject 80 Units into the skin daily.      TRULICITY 4.5 CV/8.9FY SOPN Inject into the skin.     vitamin B-12 (CYANOCOBALAMIN) 1000 MCG tablet Take 1 tablet once a day     XIGDUO XR 04-999 MG TB24 TAKE 2 TABLETS BY MOUTH ONCE DAILY BEFORE BREAKFAST (Patient not taking: Reported on 10/12/2021)     No current facility-administered medications on file prior to visit.    Allergies  Allergen Reactions   Codeine Nausea And Vomiting   Social History   Occupational History   Not on file  Tobacco Use   Smoking status: Former    Years: 9.00    Types: Cigarettes    Quit date: 10/2006    Years since quitting: 15.2   Smokeless tobacco: Never  Vaping Use   Vaping Use: Never used  Substance and Sexual Activity   Alcohol use: Yes    Comment: occ   Drug use: No   Sexual activity: Not on file   Family History  Problem Relation Age of Onset   Breast cancer Neg Hx    Immunization History  Administered Date(s) Administered   Influenza Split 10/08/2012, 10/22/2013, 11/04/2014, 10/24/2015, 09/16/2016, 08/26/2017, 08/31/2018, 09/30/2020   Influenza,inj,Quad PF,6+ Mos 08/26/2017, 08/31/2018    Influenza,inj,Quad PF,6-35 Mos 08/24/2019   Influenza,inj,quad, With Preservative 10/08/2012, 11/04/2014   Janssen (J&J) SARS-COV-2 Vaccination 03/02/2020   Moderna Sars-Covid-2 Vaccination 11/03/2020   Pneumococcal Polysaccharide-23 08/31/2012   Td 10/02/2001   Tdap 08/31/2012     Review of Systems: Negative except as noted in the HPI.   Objective: There were no vitals filed for this visit.  SHALANDRIA ELSBERND is a pleasant 51 y.o. female in NAD. AAO X 3.  Vascular Examination: CFT immediate b/l LE. Palpable DP/PT pulses b/l LE. Digital hair present b/l. Skin temperature gradient WNL b/l. No pain with calf compression b/l. No edema noted b/l. No cyanosis or clubbing noted b/l LE.  Dermatological Examination: Pedal integument with normal turgor, texture and tone  BLE. No open wounds b/l LE. No interdigital macerations noted b/l LE. Toenails 2-5 bilaterally and L hallux elongated, discolored, dystrophic, thickened, and crumbly with subungual debris and tenderness to dorsal palpation. Hyperkeratotic lesion(s) R hallux, R 2nd toe, submet head 1 b/l, and submet head 2 left foot.  No erythema, no edema, no drainage, no fluctuance.  Musculoskeletal Examination: Muscle strength 5/5 to all lower extremity muscle groups bilaterally. Lower extremity amputation(s): partial amputation of R hallux. Patient ambulates independent of any assistive aids.  Footwear Assessment: Does the patient wear appropriate shoes? Yes. Does the patient need inserts/orthotics? Yes.  Neurological Examination: Pt has subjective symptoms of neuropathy. Protective sensation intact 5/5 intact bilaterally with 10g monofilament b/l. Proprioception intact bilaterally.  Assessment: 1. Onychomycosis   2. Callus   3. History of amputation of hallux ([place])   4. DM type 2 with diabetic peripheral neuropathy ([place])   5. Encounter for diabetic foot exam ([place])     ADA Risk Categorization: High Risk  Patient has one or more of  the following: Loss of protective sensation Absent pedal pulses Severe Foot deformity History of foot ulcer  Plan: -Diabetic foot examination performed today. -Continue foot and shoe inspections daily. Monitor blood glucose per PCP/Endocrinologist's recommendations. -Continue diabetic shoes daily. -Mycotic toenails 2-5 bilaterally and L hallux were debrided in length and girth with sterile nail nippers and dremel without iatrogenic bleeding. -Callus(es) R hallux, R 2nd toe, submet head 1 b/l, and submet head 2 left foot pared utilizing mandrel sander without complication or incident. Total number debrided =5. -Patient/POA to call should there be question/concern in the interim.  Return in about 9 weeks (around 03/27/2022) for nail trim.  Marzetta Board, DPM

## 2022-01-27 ENCOUNTER — Encounter: Payer: Self-pay | Admitting: Podiatry

## 2022-02-19 DIAGNOSIS — E113293 Type 2 diabetes mellitus with mild nonproliferative diabetic retinopathy without macular edema, bilateral: Secondary | ICD-10-CM | POA: Diagnosis not present

## 2022-03-05 DIAGNOSIS — E538 Deficiency of other specified B group vitamins: Secondary | ICD-10-CM | POA: Diagnosis not present

## 2022-03-05 DIAGNOSIS — E1169 Type 2 diabetes mellitus with other specified complication: Secondary | ICD-10-CM | POA: Diagnosis not present

## 2022-03-05 DIAGNOSIS — E1165 Type 2 diabetes mellitus with hyperglycemia: Secondary | ICD-10-CM | POA: Diagnosis not present

## 2022-03-05 DIAGNOSIS — E559 Vitamin D deficiency, unspecified: Secondary | ICD-10-CM | POA: Diagnosis not present

## 2022-03-05 DIAGNOSIS — E113293 Type 2 diabetes mellitus with mild nonproliferative diabetic retinopathy without macular edema, bilateral: Secondary | ICD-10-CM | POA: Diagnosis not present

## 2022-03-05 DIAGNOSIS — E1159 Type 2 diabetes mellitus with other circulatory complications: Secondary | ICD-10-CM | POA: Diagnosis not present

## 2022-03-05 DIAGNOSIS — E1142 Type 2 diabetes mellitus with diabetic polyneuropathy: Secondary | ICD-10-CM | POA: Diagnosis not present

## 2022-03-05 DIAGNOSIS — E785 Hyperlipidemia, unspecified: Secondary | ICD-10-CM | POA: Diagnosis not present

## 2022-03-05 DIAGNOSIS — I152 Hypertension secondary to endocrine disorders: Secondary | ICD-10-CM | POA: Diagnosis not present

## 2022-03-05 DIAGNOSIS — Z794 Long term (current) use of insulin: Secondary | ICD-10-CM | POA: Diagnosis not present

## 2022-03-19 DIAGNOSIS — Z794 Long term (current) use of insulin: Secondary | ICD-10-CM | POA: Diagnosis not present

## 2022-03-19 DIAGNOSIS — E1165 Type 2 diabetes mellitus with hyperglycemia: Secondary | ICD-10-CM | POA: Diagnosis not present

## 2022-03-19 DIAGNOSIS — E113293 Type 2 diabetes mellitus with mild nonproliferative diabetic retinopathy without macular edema, bilateral: Secondary | ICD-10-CM | POA: Diagnosis not present

## 2022-03-19 DIAGNOSIS — Z7985 Long-term (current) use of injectable non-insulin antidiabetic drugs: Secondary | ICD-10-CM | POA: Diagnosis not present

## 2022-03-19 DIAGNOSIS — Z7984 Long term (current) use of oral hypoglycemic drugs: Secondary | ICD-10-CM | POA: Diagnosis not present

## 2022-03-27 ENCOUNTER — Other Ambulatory Visit: Payer: BC Managed Care – PPO

## 2022-04-03 ENCOUNTER — Encounter: Payer: Self-pay | Admitting: Podiatry

## 2022-04-03 ENCOUNTER — Ambulatory Visit: Payer: BC Managed Care – PPO

## 2022-04-03 ENCOUNTER — Ambulatory Visit (INDEPENDENT_AMBULATORY_CARE_PROVIDER_SITE_OTHER): Payer: BC Managed Care – PPO | Admitting: Podiatry

## 2022-04-03 DIAGNOSIS — B351 Tinea unguium: Secondary | ICD-10-CM

## 2022-04-03 DIAGNOSIS — Z89419 Acquired absence of unspecified great toe: Secondary | ICD-10-CM | POA: Diagnosis not present

## 2022-04-03 DIAGNOSIS — Q828 Other specified congenital malformations of skin: Secondary | ICD-10-CM

## 2022-04-03 DIAGNOSIS — E1142 Type 2 diabetes mellitus with diabetic polyneuropathy: Secondary | ICD-10-CM | POA: Diagnosis not present

## 2022-04-03 DIAGNOSIS — L84 Corns and callosities: Secondary | ICD-10-CM | POA: Diagnosis not present

## 2022-04-03 NOTE — Progress Notes (Signed)
SITUATION ?Patient Name:  Crystal Dudley ?MRN:   [id number] ?Reason for Visit: Evaluation for diabetic shoes and insoles ? ?Patient Report: ?Chief Complaint: Patient would like to obtain pre-certification from their insurance regarding coverage and an out of pocket estimate before proceeding. ? ?OBJECTIVE DATA ?Patient History / Diagnosis:   ?  ICD-10-CM   ?1. DM type 2 with diabetic peripheral neuropathy (HCC)  E11.42   ?  ?2. History of amputation of hallux [facility])  Z89.419   ?  ? ? ?Physician Recommended Device(s): Diabetic shoes and insoles ? ?Laterality HCPCS Code  ?bilateral A5500 x 1 pair  ?bilateral A5513 x 3 pair  ? ?ACTIONS PERFORMED ?Patient was seen for evaluation for diabetic shoes and insoles. Financial responsibility was discussed, and patient requested a check of benefits and an estimation of their out of pocket cost for the equipment.  ? ?PLAN ?patient is to be contacted once insurance pre-certification and out of pocket cost estimate is obtained so that they make an informed decision regarding plan of care. In the event patient elects not to pursue orthotic treatment, referring physician is to be contacted in order to update plan of care as needed. All questions were answered and concerns addressed. ? ? ? ? ? ?

## 2022-04-11 NOTE — Progress Notes (Signed)
?  Subjective:  ?Patient ID: Crystal Dudley, female    DOB: 1971/07/28,  MRN: [id number] ? ?Crystal Dudley presents to clinic today for at risk foot care. Patient has h/o amputation of digital amputation right great toe ? ?Last HgA1c was 6.6%. Patient did not check blood glucose today. ? ?New problem(s): None.  ? ?PCP is [name], MD , and last visit was September 27, 2021. ? ?Allergies  ?Allergen Reactions  ? Codeine Nausea And Vomiting  ? ?Review of Systems: Negative except as noted in the HPI. ? ?Objective:  ? ?Crystal Dudley is a pleasant 51 y.o. female in NAD. AAO X 3. ? ?Vascular Examination: ?CFT immediate b/l LE. Palpable DP/PT pulses b/l LE. Digital hair present b/l. Skin temperature gradient WNL b/l. No pain with calf compression b/l. No edema noted b/l. No cyanosis or clubbing noted b/l LE. ? ?Dermatological Examination: ?Pedal integument with normal turgor, texture and tone BLE. No open wounds b/l LE. No interdigital macerations noted b/l LE. Toenails 2-5 bilaterally and L hallux elongated, discolored, dystrophic, thickened, and crumbly with subungual debris and tenderness to dorsal palpation. Hyperkeratotic lesion(s) submet head 1 b/l.  No erythema, no edema, no drainage, no fluctuance. Porokeratotic lesion left 5th toe. No erythema, no edema, no drainage, no flocculence.  ? ?Musculoskeletal Examination: ?Muscle strength 5/5 to all lower extremity muscle groups bilaterally. Lower extremity amputation(s): partial amputation of R hallux. Patient ambulates independent of any assistive aids. Patient wearing diabetic shoes. ? ?Neurological Examination: ?Pt has subjective symptoms of neuropathy. Protective sensation intact 5/5 intact bilaterally with 10g monofilament b/l. Proprioception intact bilaterally. ? ?Assessment/Plan: ?1. Onychomycosis   ?2. Callus   ?3. Porokeratosis   ?4. History of amputation of hallux (HCC)   ?5. DM type 2 with diabetic peripheral neuropathy (HCC)   ?  ?-Examined  patient. ?-Patient to continue soft, supportive shoe gear daily. Order entered for one pair extra depth shoes and 3 pair custom molded insoles. Patient qualifies based on diagnoses. ?-Toenails 2-5 bilaterally and left great toe debrided in length and girth without iatrogenic bleeding with sterile nail nipper and dremel.  ?-Callus(es) submet head 1 b/l pared utilizing sterile scalpel blade without complication or incident. Total number debrided =2. ?-Painful porokeratotic lesion(s) L 5th toe pared and enucleated with sterile scalpel blade without incident. Total number of lesions debrided=1. ?-Patient/POA to call should there be question/concern in the interim.  ? ?Return in about 9 weeks (around 06/05/2022). ? ?[name], DPM  ?

## 2022-05-09 DIAGNOSIS — R300 Dysuria: Secondary | ICD-10-CM | POA: Diagnosis not present

## 2022-07-04 DIAGNOSIS — E559 Vitamin D deficiency, unspecified: Secondary | ICD-10-CM | POA: Diagnosis not present

## 2022-07-04 DIAGNOSIS — E1169 Type 2 diabetes mellitus with other specified complication: Secondary | ICD-10-CM | POA: Diagnosis not present

## 2022-07-04 DIAGNOSIS — E538 Deficiency of other specified B group vitamins: Secondary | ICD-10-CM | POA: Diagnosis not present

## 2022-07-05 ENCOUNTER — Encounter: Payer: Self-pay | Admitting: Podiatry

## 2022-07-05 ENCOUNTER — Ambulatory Visit (INDEPENDENT_AMBULATORY_CARE_PROVIDER_SITE_OTHER): Payer: BC Managed Care – PPO | Admitting: Podiatry

## 2022-07-05 DIAGNOSIS — Q828 Other specified congenital malformations of skin: Secondary | ICD-10-CM

## 2022-07-05 DIAGNOSIS — L84 Corns and callosities: Secondary | ICD-10-CM | POA: Diagnosis not present

## 2022-07-05 DIAGNOSIS — E1142 Type 2 diabetes mellitus with diabetic polyneuropathy: Secondary | ICD-10-CM

## 2022-07-05 DIAGNOSIS — E113299 Type 2 diabetes mellitus with mild nonproliferative diabetic retinopathy without macular edema, unspecified eye: Secondary | ICD-10-CM | POA: Insufficient documentation

## 2022-07-05 DIAGNOSIS — Z89421 Acquired absence of other right toe(s): Secondary | ICD-10-CM

## 2022-07-05 DIAGNOSIS — Z89419 Acquired absence of unspecified great toe: Secondary | ICD-10-CM

## 2022-07-05 DIAGNOSIS — B351 Tinea unguium: Secondary | ICD-10-CM | POA: Diagnosis not present

## 2022-07-11 NOTE — Progress Notes (Signed)
  Subjective:  Patient ID: Crystal Dudley, female    DOB: 19-May-1971,  MRN: [id number]  Crystal Dudley presents to clinic today for for at risk foot care. Patient has h/o PAD with h/o amputation(s) of partial hallux right foot and preulcerative lesion(s) b/l lower extremities and painful mycotic toenails that limit ambulation. Painful toenails interfere with ambulation. Aggravating factors include wearing enclosed shoe gear. Pain is relieved with periodic professional debridement. Painful porokeratotic lesions are aggravated when weightbearing with and without shoegear. Pain is relieved with periodic professional debridement.  Patient states blood glucose was 160 mg/dl today.  Last A1c was 6.3%.  New problem(s): None.   PCP is [name], MD , and last visit was  May 09, 2022  Allergies  Allergen Reactions   Codeine Nausea And Vomiting    Review of Systems: Negative except as noted in the HPI.  Objective: No changes noted in today's physical examination.  Crystal Dudley is a pleasant 51 y.o. female in NAD. AAO X 3.  Vascular Examination: CFT immediate b/l LE. Palpable DP/PT pulses b/l LE. Digital hair present b/l. Skin temperature gradient WNL b/l. No pain with calf compression b/l. No edema noted b/l. No cyanosis or clubbing noted b/l LE.  Dermatological Examination: Pedal integument with normal turgor, texture and tone BLE. No open wounds b/l LE. No interdigital macerations noted b/l LE. Toenails 2-5 bilaterally and L hallux elongated, discolored, dystrophic, thickened, and crumbly with subungual debris and tenderness to dorsal palpation. Hyperkeratotic lesion(s) submet head 1 left foot.  No erythema, no edema, no drainage, no fluctuance. Porokeratotic lesion left 5th toe. No erythema, no edema, no drainage, no flocculence. Preulcerative lesion noted submet head 1 right foot. There is visible subdermal hemorrhage. There is no surrounding erythema, no edema, no drainage, no odor,  no fluctuance.   Musculoskeletal Examination: Muscle strength 5/5 to all lower extremity muscle groups bilaterally. Lower extremity amputation(s): partial amputation of R hallux. Patient ambulates independent of any assistive aids. Patient wearing diabetic shoes.  Neurological Examination: Pt has subjective symptoms of neuropathy. Protective sensation intact 5/5 intact bilaterally with 10g monofilament b/l. Proprioception intact bilaterally.  Assessment/Plan: 1. Onychomycosis   2. Pre-ulcerative calluses   3. Callus   4. Porokeratosis   5. History of partial amputation of toe of right foot (HCC)   6. DM type 2 with diabetic peripheral neuropathy (HCC)     -Patient was evaluated and treated. All patient's and/or POA's questions/concerns answered on today's visit. -Patient flagged in Epic for Limb at Risk. Ordered noninvasive arterial studies ABIs with and without TBIs for b/l lower extremities. -Continue foot and shoe inspections daily. Monitor blood glucose per PCP/Endocrinologist's recommendations. -Toenails 2-5 bilaterally and L hallux debrided in length and girth without iatrogenic bleeding with sterile nail nipper and dremel.  -Callus(es) submet head 1 left foot pared utilizing sterile scalpel blade without complication or incident. Total number debrided =1. -Preulcerative lesion pared submet head 1 right foot. Total number pared=1. -Porokeratotic lesion(s) L 5th toe pared and enucleated with sterile scalpel blade without incident. Total number of lesions debrided=1. -Patient/POA to call should there be question/concern in the interim.   Return in about 9 weeks (around 09/06/2022).  [name], DPM

## 2022-07-19 DIAGNOSIS — Z7984 Long term (current) use of oral hypoglycemic drugs: Secondary | ICD-10-CM | POA: Diagnosis not present

## 2022-07-19 DIAGNOSIS — I152 Hypertension secondary to endocrine disorders: Secondary | ICD-10-CM | POA: Diagnosis not present

## 2022-07-19 DIAGNOSIS — Z794 Long term (current) use of insulin: Secondary | ICD-10-CM | POA: Diagnosis not present

## 2022-07-19 DIAGNOSIS — E1169 Type 2 diabetes mellitus with other specified complication: Secondary | ICD-10-CM | POA: Diagnosis not present

## 2022-07-19 DIAGNOSIS — E1159 Type 2 diabetes mellitus with other circulatory complications: Secondary | ICD-10-CM | POA: Diagnosis not present

## 2022-07-19 DIAGNOSIS — E1165 Type 2 diabetes mellitus with hyperglycemia: Secondary | ICD-10-CM | POA: Diagnosis not present

## 2022-07-19 DIAGNOSIS — Z7985 Long-term (current) use of injectable non-insulin antidiabetic drugs: Secondary | ICD-10-CM | POA: Diagnosis not present

## 2022-07-31 ENCOUNTER — Ambulatory Visit (INDEPENDENT_AMBULATORY_CARE_PROVIDER_SITE_OTHER): Payer: BC Managed Care – PPO

## 2022-07-31 DIAGNOSIS — E1142 Type 2 diabetes mellitus with diabetic polyneuropathy: Secondary | ICD-10-CM

## 2022-07-31 DIAGNOSIS — Z89421 Acquired absence of other right toe(s): Secondary | ICD-10-CM

## 2022-08-21 ENCOUNTER — Encounter: Payer: Self-pay | Admitting: Podiatry

## 2022-09-03 ENCOUNTER — Ambulatory Visit (INDEPENDENT_AMBULATORY_CARE_PROVIDER_SITE_OTHER): Payer: BC Managed Care – PPO | Admitting: Podiatry

## 2022-09-03 ENCOUNTER — Encounter: Payer: Self-pay | Admitting: Podiatry

## 2022-09-03 DIAGNOSIS — M79675 Pain in left toe(s): Secondary | ICD-10-CM

## 2022-09-03 DIAGNOSIS — M79674 Pain in right toe(s): Secondary | ICD-10-CM | POA: Diagnosis not present

## 2022-09-03 DIAGNOSIS — E1142 Type 2 diabetes mellitus with diabetic polyneuropathy: Secondary | ICD-10-CM

## 2022-09-03 DIAGNOSIS — Q828 Other specified congenital malformations of skin: Secondary | ICD-10-CM

## 2022-09-03 DIAGNOSIS — B351 Tinea unguium: Secondary | ICD-10-CM

## 2022-09-03 DIAGNOSIS — Z89421 Acquired absence of other right toe(s): Secondary | ICD-10-CM

## 2022-09-03 DIAGNOSIS — L84 Corns and callosities: Secondary | ICD-10-CM | POA: Diagnosis not present

## 2022-09-05 NOTE — Progress Notes (Signed)
  Subjective:  Patient ID: Crystal Dudley, female    DOB: Dec 18, 1971,  MRN: [id number]  Crystal Dudley presents to clinic today for at risk foot care. Patient has h/o NIDDM, neuropathy with amputation of partial amputation of right great toe and callus(es) b/l lower extremities and painful thick toenails that are difficult to trim. Painful toenails interfere with ambulation. Aggravating factors include wearing enclosed shoe gear. Pain is relieved with periodic professional debridement. Painful calluses are aggravated when weightbearing with and without shoegear. Pain is relieved with periodic professional debridement.  Last known  HgA1c was 7.4%.  Patient did not check blood glucose this morning.  New problem(s): None.   She is inquiring about the status of her diabetic shoes and suspects the department thinks she has Medicare as they are awaiting Medicare certification paperwork. Patient states she does not have Medicare.  PCP is [name], MD , and last visit was September, 2022.  Allergies  Allergen Reactions   Codeine Nausea And Vomiting   Review of Systems: Negative except as noted in the HPI.  Objective: No changes noted in today's physical examination. Crystal Dudley is a pleasant 51 y.o. female in NAD. AAO x 3.  Vascular Examination: CFT immediate b/l LE. Palpable DP/PT pulses b/l LE. Digital hair present b/l. Skin temperature gradient WNL b/l. No pain with calf compression b/l. No edema noted b/l. No cyanosis or clubbing noted b/l LE.  Dermatological Examination: Pedal integument with normal turgor, texture and tone BLE. No open wounds b/l LE. No interdigital macerations noted b/l LE. Toenails 2-5 bilaterally and L hallux elongated, discolored, dystrophic, thickened, and crumbly with subungual debris and tenderness to dorsal palpation.   Hyperkeratotic lesion(s) plantar aspect right 2nd toe DIPJ and submet head 1 right foot.  No erythema, no edema, no drainage, no  fluctuance.   Porokeratotic lesion submet head 1 left foot.  No erythema, no edema, no drainage, no flocculence.There is no surrounding erythema, no edema, no drainage, no odor, no fluctuance.  Musculoskeletal Examination: Muscle strength 5/5 to all lower extremity muscle groups bilaterally. Lower extremity amputation(s): partial amputation of R hallux. Patient ambulates independent of any assistive aids. Patient wearing diabetic shoes.  Neurological Examination: Pt has subjective symptoms of neuropathy. Protective sensation intact 5/5 intact bilaterally with 10g monofilament b/l. Proprioception intact bilaterally.  Assessment/Plan: 1. Pain due to onychomycosis of toenails of both feet   2. Porokeratosis   3. Callus   4. History of partial amputation of toe of right foot (HCC)   5. DM type 2 with diabetic peripheral neuropathy (HCC)     -Examined patient. -Discussed patient's diabetic shoes with staff Adela Ports who will order patient's shoes today. -Mycotic toenails 2-5 bilaterally and L hallux were debrided in length and girth with sterile nail nippers and dremel without iatrogenic bleeding. -Callus(es) R 2nd toe and submet head 1 right foot pared utilizing sterile scalpel blade without complication or incident. Total number debrided =2. -Porokeratotic lesion(s) submet head 1 left foot pared and enucleated with sterile scalpel blade without incident. Total number of lesions debrided=1. -Patient/POA to call should there be question/concern in the interim.   Return in about 9 weeks (around 11/05/2022).  [name], DPM

## 2022-10-05 ENCOUNTER — Telehealth: Payer: Self-pay | Admitting: Podiatry

## 2022-10-05 NOTE — Telephone Encounter (Signed)
LVM for pt to call back for an appt for DM SHOE PICK UP  

## 2022-10-10 DIAGNOSIS — N181 Chronic kidney disease, stage 1: Secondary | ICD-10-CM | POA: Diagnosis not present

## 2022-10-10 DIAGNOSIS — Z0000 Encounter for general adult medical examination without abnormal findings: Secondary | ICD-10-CM | POA: Diagnosis not present

## 2022-10-10 DIAGNOSIS — I129 Hypertensive chronic kidney disease with stage 1 through stage 4 chronic kidney disease, or unspecified chronic kidney disease: Secondary | ICD-10-CM | POA: Diagnosis not present

## 2022-10-10 DIAGNOSIS — E1121 Type 2 diabetes mellitus with diabetic nephropathy: Secondary | ICD-10-CM | POA: Diagnosis not present

## 2022-10-11 DIAGNOSIS — E785 Hyperlipidemia, unspecified: Secondary | ICD-10-CM | POA: Diagnosis not present

## 2022-10-11 DIAGNOSIS — I152 Hypertension secondary to endocrine disorders: Secondary | ICD-10-CM | POA: Diagnosis not present

## 2022-10-11 DIAGNOSIS — E1142 Type 2 diabetes mellitus with diabetic polyneuropathy: Secondary | ICD-10-CM | POA: Diagnosis not present

## 2022-10-11 DIAGNOSIS — E538 Deficiency of other specified B group vitamins: Secondary | ICD-10-CM | POA: Diagnosis not present

## 2022-10-11 DIAGNOSIS — E1159 Type 2 diabetes mellitus with other circulatory complications: Secondary | ICD-10-CM | POA: Diagnosis not present

## 2022-10-11 DIAGNOSIS — E1169 Type 2 diabetes mellitus with other specified complication: Secondary | ICD-10-CM | POA: Diagnosis not present

## 2022-10-12 ENCOUNTER — Other Ambulatory Visit: Payer: Self-pay | Admitting: Family Medicine

## 2022-10-12 DIAGNOSIS — Z1231 Encounter for screening mammogram for malignant neoplasm of breast: Secondary | ICD-10-CM

## 2022-10-15 ENCOUNTER — Ambulatory Visit (INDEPENDENT_AMBULATORY_CARE_PROVIDER_SITE_OTHER): Payer: BC Managed Care – PPO

## 2022-10-15 DIAGNOSIS — L84 Corns and callosities: Secondary | ICD-10-CM

## 2022-10-15 DIAGNOSIS — Z89421 Acquired absence of other right toe(s): Secondary | ICD-10-CM | POA: Diagnosis not present

## 2022-10-15 DIAGNOSIS — E1142 Type 2 diabetes mellitus with diabetic polyneuropathy: Secondary | ICD-10-CM | POA: Diagnosis not present

## 2022-10-15 NOTE — Progress Notes (Signed)
Patient presents today to pick up diabetic shoes and insoles.  Patient was dispensed 1 pair of diabetic shoes and 3 pairs of foam casted diabetic insoles. Fit was satisfactory. Instructions for break-in and wear was reviewed and a copy was given to the patient.   Re-appointment for regularly scheduled diabetic foot care visits or if they should experience any trouble with the shoes or insoles.  

## 2022-10-25 DIAGNOSIS — E1169 Type 2 diabetes mellitus with other specified complication: Secondary | ICD-10-CM | POA: Diagnosis not present

## 2022-10-25 DIAGNOSIS — E559 Vitamin D deficiency, unspecified: Secondary | ICD-10-CM | POA: Diagnosis not present

## 2022-10-25 DIAGNOSIS — Z794 Long term (current) use of insulin: Secondary | ICD-10-CM | POA: Diagnosis not present

## 2022-10-25 DIAGNOSIS — Z7985 Long-term (current) use of injectable non-insulin antidiabetic drugs: Secondary | ICD-10-CM | POA: Diagnosis not present

## 2022-10-25 DIAGNOSIS — E538 Deficiency of other specified B group vitamins: Secondary | ICD-10-CM | POA: Diagnosis not present

## 2022-10-25 DIAGNOSIS — Z7984 Long term (current) use of oral hypoglycemic drugs: Secondary | ICD-10-CM | POA: Diagnosis not present

## 2022-10-25 DIAGNOSIS — E1165 Type 2 diabetes mellitus with hyperglycemia: Secondary | ICD-10-CM | POA: Diagnosis not present

## 2022-11-06 ENCOUNTER — Ambulatory Visit (INDEPENDENT_AMBULATORY_CARE_PROVIDER_SITE_OTHER): Payer: BC Managed Care – PPO | Admitting: Podiatry

## 2022-11-06 ENCOUNTER — Encounter: Payer: Self-pay | Admitting: Podiatry

## 2022-11-06 DIAGNOSIS — M79674 Pain in right toe(s): Secondary | ICD-10-CM | POA: Diagnosis not present

## 2022-11-06 DIAGNOSIS — B351 Tinea unguium: Secondary | ICD-10-CM | POA: Diagnosis not present

## 2022-11-06 DIAGNOSIS — Z89421 Acquired absence of other right toe(s): Secondary | ICD-10-CM | POA: Diagnosis not present

## 2022-11-06 DIAGNOSIS — M79675 Pain in left toe(s): Secondary | ICD-10-CM

## 2022-11-06 DIAGNOSIS — E1142 Type 2 diabetes mellitus with diabetic polyneuropathy: Secondary | ICD-10-CM

## 2022-11-06 DIAGNOSIS — L84 Corns and callosities: Secondary | ICD-10-CM

## 2022-11-11 NOTE — Progress Notes (Signed)
  Subjective:  Patient ID: Crystal Dudley, female    DOB: 02-12-1971,  MRN: [id number]  Crystal Dudley presents to clinic today for at risk foot care with history of diabetic neuropathy and preulcerative lesion(s) b/l lower extremities and painful mycotic toenails that limit ambulation. Painful toenails interfere with ambulation. Aggravating factors include wearing enclosed shoe gear. Pain is relieved with periodic professional debridement. Painful porokeratotic lesions are aggravated when weightbearing with and without shoegear. Pain is relieved with periodic professional debridement.  Chief Complaint  Patient presents with   Nail Problem    Diabetic foot care BS-Did not check today A1C-7.0 PCP-Kimberlee Shaw PCP VST-09/2022   New problem(s): None.   PCP is [name], MD.  Allergies  Allergen Reactions   Codeine Nausea And Vomiting   Review of Systems: Negative except as noted in the HPI.  Objective: No changes noted in today's physical examination.  Crystal Dudley is a pleasant 51 y.o. female in NAD. AAO x 3.  Vascular Examination: CFT immediate b/l LE. Palpable DP/PT pulses b/l LE. Digital hair present b/l. Skin temperature gradient WNL b/l. No pain with calf compression b/l. No edema noted b/l. No cyanosis or clubbing noted b/l LE.  Dermatological Examination: Pedal integument with normal turgor, texture and tone BLE. No open wounds b/l LE. No interdigital macerations noted b/l LE. Toenails 2-5 bilaterally and L hallux elongated, discolored, dystrophic, thickened, and crumbly with subungual debris and tenderness to dorsal palpation.   Hyperkeratotic lesion(s) plantar aspect right 2nd toe DIPJ and submet head 1 right foot.  No erythema, no edema, no drainage, no fluctuance.   Porokeratotic lesion submet head 1 left foot.  No erythema, no edema, no drainage, no flocculence.  Musculoskeletal Examination: Muscle strength 5/5 to all lower extremity muscle groups  bilaterally. Lower extremity amputation(s): partial amputation of R hallux. Patient ambulates independent of any assistive aids. Patient wearing diabetic shoes.  Neurological Examination: Pt has subjective symptoms of neuropathy. Protective sensation intact 5/5 intact bilaterally with 10g monofilament b/l. Proprioception intact bilaterally. Assessment/Plan: 1. Pain due to onychomycosis of toenails of both feet   2. History of partial amputation of toe of right foot (HCC)   3. Callus   4. DM type 2 with diabetic peripheral neuropathy (HCC)     No orders of the defined types were placed in this encounter.   -Consent given for treatment as described below: -Continue diabetic shoes daily. -Toenails 2-5 bilaterally and L hallux debrided in length and girth without iatrogenic bleeding with sterile nail nipper and dremel.  -Callus(es) R 2nd toe and submet head 1 b/l pared utilizing sterile scalpel blade without complication or incident. Total number debrided =3. -Patient/POA to call should there be question/concern in the interim.   Return in about 9 weeks (around 01/08/2023).  Crystal Dudley, DPM

## 2022-12-30 ENCOUNTER — Ambulatory Visit
Admission: RE | Admit: 2022-12-30 | Discharge: 2022-12-30 | Disposition: A | Discharge status: Home / Self Care | Payer: BC Managed Care – PPO | Source: Ambulatory Visit | Attending: Family Medicine | Admitting: Family Medicine

## 2022-12-30 DIAGNOSIS — Z1231 Encounter for screening mammogram for malignant neoplasm of breast: Secondary | ICD-10-CM | POA: Diagnosis not present

## 2023-01-08 ENCOUNTER — Ambulatory Visit (INDEPENDENT_AMBULATORY_CARE_PROVIDER_SITE_OTHER): Payer: BC Managed Care – PPO | Admitting: Podiatry

## 2023-01-08 ENCOUNTER — Encounter: Payer: Self-pay | Admitting: Podiatry

## 2023-01-08 VITALS — BP 140/69

## 2023-01-08 DIAGNOSIS — M79674 Pain in right toe(s): Secondary | ICD-10-CM | POA: Diagnosis not present

## 2023-01-08 DIAGNOSIS — M79675 Pain in left toe(s): Secondary | ICD-10-CM | POA: Diagnosis not present

## 2023-01-08 DIAGNOSIS — Z89421 Acquired absence of other right toe(s): Secondary | ICD-10-CM | POA: Diagnosis not present

## 2023-01-08 DIAGNOSIS — L84 Corns and callosities: Secondary | ICD-10-CM

## 2023-01-08 DIAGNOSIS — E1142 Type 2 diabetes mellitus with diabetic polyneuropathy: Secondary | ICD-10-CM | POA: Diagnosis not present

## 2023-01-08 DIAGNOSIS — B351 Tinea unguium: Secondary | ICD-10-CM

## 2023-01-08 DIAGNOSIS — Q828 Other specified congenital malformations of skin: Secondary | ICD-10-CM | POA: Diagnosis not present

## 2023-01-08 NOTE — Progress Notes (Signed)
  Subjective:  Patient ID: Crystal Dudley, female    DOB: [birth year],  MRN: [id number]  Crystal Dudley presents to clinic today for at risk foot care. Patient has h/o diabetes with history of ulceration of right great toe  Chief Complaint  Patient presents with   Nail Problem    DFC BS-did not check today A1C-7.0 PCP-Shaw, Kimberlee PCP VST-09/2022   New problem(s): None.   She has received her new diabetic shoes and are pleased with them. She voices no new pedal concerns on today's visit.  PCP is Mayra Neer, MD.  Allergies  Allergen Reactions   Codeine Nausea And Vomiting   Review of Systems: Negative except as noted in the HPI.  Objective: No changes noted in today's physical examination. Vitals:   01/08/23 0818  BP: (!) 140/69   Crystal Dudley is a pleasant 52 y.o. female obese in NAD. AAO x 3.  Vascular Examination: CFT immediate b/l LE. Palpable DP/PT pulses b/l LE. Digital hair present b/l. Skin temperature gradient WNL b/l. No pain with calf compression b/l. No edema noted b/l. No cyanosis or clubbing noted b/l LE.  Dermatological Examination: Pedal integument with normal turgor, texture and tone BLE. No open wounds b/l LE. No interdigital macerations noted b/l LE.   Toenails 2-5 bilaterally and L hallux elongated, discolored, dystrophic, thickened, and crumbly with subungual debris and tenderness to dorsal palpation.   Hyperkeratotic lesion(s) plantar aspect right 2nd toe DIPJ and submet head 1 right foot.  No erythema, no edema, no drainage, no fluctuance.   Porokeratotic lesion submet head 1 left foot.  No erythema, no edema, no drainage, no flocculence.  Musculoskeletal Examination: Muscle strength 5/5 to all lower extremity muscle groups bilaterally. Lower extremity amputation(s): partial amputation of R hallux. Patient ambulates independent of any assistive aids. Patient wearing diabetic shoes.  Neurological Examination: Pt has subjective symptoms  of neuropathy. Protective sensation intact 5/5 intact bilaterally with 10g monofilament b/l. Proprioception intact bilaterally.  Assessment/Plan: 1. Pain due to onychomycosis of toenails of both feet   2. Callus   3. Porokeratosis   4. History of partial amputation of toe of right foot ([place])   5. DM type 2 with diabetic peripheral neuropathy (HCC)     No orders of the defined types were placed in this encounter.   -Patient was evaluated and treated. All patient's and/or POA's questions/concerns answered on today's visit. -Continue supportive shoe gear daily. -Toenails 2-5 bilaterally and left great toe debrided in length and girth without iatrogenic bleeding with sterile nail nipper and dremel.  -Callus(es) R hallux and submet head 1 right foot pared utilizing sterile scalpel blade without complication or incident. Total number debrided =2. -Porokeratotic lesion(s) submet head 1 left foot pared and enucleated with sterile currette without incident. Total number of lesions debrided=1. -Patient/POA to call should there be question/concern in the interim.   Return in about 9 weeks (around 03/12/2023).  Marzetta Board, DPM

## 2023-01-31 DIAGNOSIS — E113293 Type 2 diabetes mellitus with mild nonproliferative diabetic retinopathy without macular edema, bilateral: Secondary | ICD-10-CM | POA: Diagnosis not present

## 2023-01-31 DIAGNOSIS — E785 Hyperlipidemia, unspecified: Secondary | ICD-10-CM | POA: Diagnosis not present

## 2023-01-31 DIAGNOSIS — E1159 Type 2 diabetes mellitus with other circulatory complications: Secondary | ICD-10-CM | POA: Diagnosis not present

## 2023-01-31 DIAGNOSIS — E559 Vitamin D deficiency, unspecified: Secondary | ICD-10-CM | POA: Diagnosis not present

## 2023-01-31 DIAGNOSIS — I152 Hypertension secondary to endocrine disorders: Secondary | ICD-10-CM | POA: Diagnosis not present

## 2023-01-31 DIAGNOSIS — E1169 Type 2 diabetes mellitus with other specified complication: Secondary | ICD-10-CM | POA: Diagnosis not present

## 2023-01-31 DIAGNOSIS — Z794 Long term (current) use of insulin: Secondary | ICD-10-CM | POA: Diagnosis not present

## 2023-01-31 DIAGNOSIS — E1165 Type 2 diabetes mellitus with hyperglycemia: Secondary | ICD-10-CM | POA: Diagnosis not present

## 2023-01-31 DIAGNOSIS — E1142 Type 2 diabetes mellitus with diabetic polyneuropathy: Secondary | ICD-10-CM | POA: Diagnosis not present

## 2023-02-14 DIAGNOSIS — Z7985 Long-term (current) use of injectable non-insulin antidiabetic drugs: Secondary | ICD-10-CM | POA: Diagnosis not present

## 2023-02-14 DIAGNOSIS — E1169 Type 2 diabetes mellitus with other specified complication: Secondary | ICD-10-CM | POA: Diagnosis not present

## 2023-02-14 DIAGNOSIS — E1165 Type 2 diabetes mellitus with hyperglycemia: Secondary | ICD-10-CM | POA: Diagnosis not present

## 2023-02-14 DIAGNOSIS — E1159 Type 2 diabetes mellitus with other circulatory complications: Secondary | ICD-10-CM | POA: Diagnosis not present

## 2023-02-14 DIAGNOSIS — Z7984 Long term (current) use of oral hypoglycemic drugs: Secondary | ICD-10-CM | POA: Diagnosis not present

## 2023-02-14 DIAGNOSIS — Z794 Long term (current) use of insulin: Secondary | ICD-10-CM | POA: Diagnosis not present

## 2023-02-14 DIAGNOSIS — I152 Hypertension secondary to endocrine disorders: Secondary | ICD-10-CM | POA: Diagnosis not present

## 2023-02-21 DIAGNOSIS — E113293 Type 2 diabetes mellitus with mild nonproliferative diabetic retinopathy without macular edema, bilateral: Secondary | ICD-10-CM | POA: Diagnosis not present

## 2023-02-21 DIAGNOSIS — D3131 Benign neoplasm of right choroid: Secondary | ICD-10-CM | POA: Diagnosis not present

## 2023-03-12 ENCOUNTER — Ambulatory Visit (INDEPENDENT_AMBULATORY_CARE_PROVIDER_SITE_OTHER): Payer: BC Managed Care – PPO | Admitting: Podiatry

## 2023-03-12 ENCOUNTER — Encounter: Payer: Self-pay | Admitting: Podiatry

## 2023-03-12 VITALS — BP 140/62

## 2023-03-12 DIAGNOSIS — B351 Tinea unguium: Secondary | ICD-10-CM | POA: Diagnosis not present

## 2023-03-12 DIAGNOSIS — L84 Corns and callosities: Secondary | ICD-10-CM

## 2023-03-12 DIAGNOSIS — Z89421 Acquired absence of other right toe(s): Secondary | ICD-10-CM

## 2023-03-12 DIAGNOSIS — M79674 Pain in right toe(s): Secondary | ICD-10-CM | POA: Diagnosis not present

## 2023-03-12 DIAGNOSIS — E119 Type 2 diabetes mellitus without complications: Secondary | ICD-10-CM

## 2023-03-12 DIAGNOSIS — E1142 Type 2 diabetes mellitus with diabetic polyneuropathy: Secondary | ICD-10-CM | POA: Diagnosis not present

## 2023-03-12 DIAGNOSIS — M79675 Pain in left toe(s): Secondary | ICD-10-CM

## 2023-03-12 NOTE — Progress Notes (Signed)
ANNUAL DIABETIC FOOT EXAM  Subjective: Crystal Dudley presents today for annual diabetic foot examination.  Chief Complaint  Patient presents with   Nail Problem    DFC BS-did not check A1C-6.6 PCP-Shaw, Kimberlee PCP VST-09/2022   Patient confirms h/o diabetes.  Patient relates 16 year h/o diabetes.  Patient has h/o amputation(s): partial amputation of right great toe.  Patient has been diagnosed with neuropathy.  Risk factors: diabetes, diabetic neuropathy, history of amputation right great toe, HTN, CKD, dyslipidemia, h/o tobacco use in remission.  Mayra Neer, MD is patient's PCP.  Past Medical History:  Diagnosis Date   Diabetes mellitus without complication ([place])    Type II   Patient Active Problem List   Diagnosis Date Noted   Background diabetic retinopathy ([place]) 07/05/2022   Diabetic foot ulcer ([place]) 02/27/2021   Family history of colonic polyps 02/27/2021   Leukocytosis 02/27/2021   Mixed hyperlipidemia 02/27/2021   Amputation of toe ([place]) 12/19/2020   Body mass index (BMI) 40.0-44.9, adult ([place]) 12/19/2020   Chronic kidney disease due to hypertension 12/19/2020   Colon cancer screening 12/19/2020   Vitamin B12 deficiency 05/20/2019   History of partial amputation of toe of right foot ([place]) 11/01/2017   Severe obesity ([place]) 04/14/2017   Vitamin D deficiency 04/14/2017   Type 2 diabetes mellitus with morbid obesity ([place]) 04/14/2017   Diabetes ([place]) 02/24/2014   Unspecified essential hypertension 02/24/2014   Dyslipidemia 02/24/2014   Past Surgical History:  Procedure Laterality Date   AMPUTATION TOE Right 09/05/2017   Procedure: AMPUTATION TOE;  Surgeon: Evelina Bucy, DPM;  Location: [facility];  Service: Podiatry;  Laterality: Right;   Current Outpatient Medications on File Prior to Visit  Medication Sig Dispense Refill   aspirin 81 MG EC tablet 1 tablet     azelastine (ASTELIN) 0.1 % nasal spray USE 2 SPRAY(S) IN EACH NOSTRIL TWICE DAILY      Cholecalciferol (VITAMIN D3) 5000 units CAPS Take 5,000 Units by mouth daily.     Cranberry-Vitamin C (AZO CRANBERRY URINARY TRACT PO) Take 1 tablet by mouth daily.     Cranberry-Vitamin C-Probiotic (AZO CRANBERRY) 250-30 MG TABS      cyanocobalamin 1000 MCG tablet Take by mouth.     fluticasone (FLONASE ALLERGY RELIEF) 50 MCG/ACT nasal spray 1 spray in each nostril     hydrochlorothiazide (HYDRODIURIL) 25 MG tablet Take 25 mg by mouth daily.     HYDROcodone bit-homatropine (HYCODAN) 5-1.5 MG/5ML syrup Take 5 mLs by mouth every 6 (six) hours as needed.     HYDROcodone-acetaminophen (NORCO/VICODIN) 5-325 MG tablet Take 1 tablet by mouth every 4 (four) hours as needed for moderate pain. 20 tablet 0   ketoconazole (NIZORAL) 2 % cream 1 application to affected area     lisinopril (PRINIVIL,ZESTRIL) 40 MG tablet Take 40 mg by mouth daily.      loratadine (CLARITIN) 10 MG tablet Take 10 mg by mouth daily.     Magnesium 250 MG TABS Take 250 mg by mouth daily.     metFORMIN (GLUCOPHAGE-XR) 500 MG 24 hr tablet 4 tablets with evening meal     MOUNJARO 12.5 MG/0.5ML Pen Inject into the skin.     ONETOUCH VERIO test strip      pioglitazone (ACTOS) 15 MG tablet Take 15 mg by mouth daily.      polyethylene glycol-electrolytes (NULYTELY) 420 g solution Take by mouth as directed.     pseudoephedrine (SUDAFED) 30 MG tablet 2 tablets as needed  simvastatin (ZOCOR) 40 MG tablet Take 40 mg by mouth daily.      TOUJEO MAX SOLOSTAR 300 UNIT/ML Solostar Pen SMARTSIG:90 Unit(s) SUB-Q Every Morning     TRESIBA FLEXTOUCH 200 UNIT/ML SOPN Inject 80 Units into the skin daily.      TRULICITY 4.5 0000000 SOPN Inject into the skin.     No current facility-administered medications on file prior to visit.    Allergies  Allergen Reactions   Codeine Nausea And Vomiting   Social History   Occupational History   Not on file  Tobacco Use   Smoking status: Former    Years: 9    Types: Cigarettes    Quit date:  10/2006    Years since quitting: 16.3   Smokeless tobacco: Never  Vaping Use   Vaping Use: Never used  Substance and Sexual Activity   Alcohol use: Yes    Comment: occ   Drug use: No   Sexual activity: Not on file   Family History  Problem Relation Age of Onset   Breast cancer Neg Hx    Immunization History  Administered Date(s) Administered   Influenza Split 10/08/2012, 10/22/2013, 11/04/2014, 10/24/2015, 09/16/2016, 08/26/2017, 08/31/2018, 09/30/2020   Influenza,inj,Quad PF,6+ Mos 08/26/2017, 08/31/2018   Influenza,inj,Quad PF,6-35 Mos 08/24/2019   Influenza,inj,quad, With Preservative 10/08/2012, 11/04/2014   Janssen (J&J) SARS-COV-2 Vaccination 03/02/2020   Moderna Sars-Covid-2 Vaccination 11/03/2020   Pneumococcal Polysaccharide-23 08/31/2012   Td 10/02/2001   Tdap 08/31/2012     Review of Systems: Negative except as noted in the HPI.   Objective: Vitals:   03/12/23 0825  BP: (!) 140/62    Crystal Dudley is a pleasant 52 y.o. female in NAD. AAO X 3.  Vascular Examination: CFT <3 seconds b/l LE. Palpable pedal pulses b/l LE. Pedal hair sparse. No pain with calf compression b/l. Lower extremity skin temperature gradient within normal limits. Trace edema noted BLE. No ischemia or gangrene noted b/l LE. No cyanosis or clubbing noted b/l LE.  Dermatological Examination: Pedal skin is warm and supple b/l LE. No open wounds b/l LE. No interdigital macerations noted b/l LE. Toenails 2-5 bilaterally and left great toe elongated, discolored, dystrophic, thickened, and crumbly with subungual debris and tenderness to dorsal palpation. Hyperkeratotic lesion(s) bilateral great toes, R 2nd toe, and submet head 1 left foot.  No erythema, no edema, no drainage, no fluctuance. Preulcerative lesion noted submet head 1 right foot. There is visible subdermal hemorrhage. There is no surrounding erythema, no edema, no drainage, no odor, no fluctuance.  Neurological Examination: Pt has  subjective symptoms of neuropathy. Protective sensation intact 5/5 intact bilaterally with 10g monofilament b/l. Vibratory sensation intact b/l. Proprioception intact bilaterally.  Musculoskeletal Examination: Normal muscle strength 5/5 to all lower extremity muscle groups bilaterally. Lower extremity amputation(s): partial amputation of right great toe.Marland Kitchen No pain, crepitus or joint limitation noted with ROM b/l LE.  Patient ambulates independently without assistive aids.  Footwear Assessment: Does the patient wear appropriate shoes? Yes. Does the patient need inserts/orthotics? Yes.  Lab Results  Component Value Date   HGBA1C 6.6 (H) 09/05/2017   ADA Risk Categorization: High Risk  Patient has one or more of the following: Loss of protective sensation Absent pedal pulses Severe Foot deformity History of foot ulcer  Assessment: 1. Pain due to onychomycosis of toenails of both feet   2. Callus   3. Pre-ulcerative calluses [L84]   4. History of partial amputation of toe of right foot ([place])   5.  DM type 2 with diabetic peripheral neuropathy ([place])   6. Encounter for diabetic foot exam ([employer])     Plan: Orders Placed This Encounter  Procedures   For Home Use Only DME Diabetic Shoe    Dispense one pair extra depth shoes with 3 pair custom insoles.    No orders of the defined types were placed in this encounter.   FOR HOME USE ONLY DME DIABETIC SHOE  -Patient was evaluated and treated. All patient's and/or POA's questions/concerns answered on today's visit. -Diabetic foot examination performed today. -Continue diabetic foot care principles: inspect feet daily, monitor glucose as recommended by PCP and/or Endocrinologist, and follow prescribed diet per PCP, Endocrinologist and/or dietician. -Continue diabetic shoes daily. -Discussed diabetic shoe benefit available based on patient's diagnoses. Patient/POA would like to proceed. Order entered for one pair extra depth shoes and 3 pair  custom molded insoles. Patient qualifies based on diagnoses. -Toenails were debrided in length and girth 2-5 bilaterally and L hallux with sterile nail nippers and dremel without iatrogenic bleeding.  -Callus(es) bilateral great toes, R 2nd toe, and submet head 1 left foot pared utilizing sterile scalpel blade without complication or incident. Total number debrided =4. -Preulcerative lesion pared submet head 1 right foot utilizing sterile scalpel blade. Total number pared=1. -Patient/POA to call should there be question/concern in the interim. Return in about 9 weeks (around 05/14/2023).  Marzetta Board, DPM

## 2023-05-14 ENCOUNTER — Encounter: Payer: Self-pay | Admitting: Podiatry

## 2023-05-14 ENCOUNTER — Ambulatory Visit (INDEPENDENT_AMBULATORY_CARE_PROVIDER_SITE_OTHER): Payer: BC Managed Care – PPO | Admitting: Podiatry

## 2023-05-14 VITALS — BP 132/59

## 2023-05-14 DIAGNOSIS — E1142 Type 2 diabetes mellitus with diabetic polyneuropathy: Secondary | ICD-10-CM | POA: Diagnosis not present

## 2023-05-14 DIAGNOSIS — B351 Tinea unguium: Secondary | ICD-10-CM | POA: Diagnosis not present

## 2023-05-14 DIAGNOSIS — M79675 Pain in left toe(s): Secondary | ICD-10-CM | POA: Diagnosis not present

## 2023-05-14 DIAGNOSIS — M79674 Pain in right toe(s): Secondary | ICD-10-CM | POA: Diagnosis not present

## 2023-05-14 DIAGNOSIS — Z89421 Acquired absence of other right toe(s): Secondary | ICD-10-CM

## 2023-05-14 DIAGNOSIS — L84 Corns and callosities: Secondary | ICD-10-CM

## 2023-05-14 NOTE — Progress Notes (Signed)
  Subjective:  Patient ID: Crystal Dudley, female    DOB: [birth year],  MRN: [id number]  Crystal Dudley presents to clinic today for at risk foot care. Patient has h/o NIDDM, neuropathy with amputation of partial amputation of right great toe and preulcerative lesion(s) right foot and painful mycotic toenails that limit ambulation. Painful toenails interfere with ambulation. Aggravating factors include wearing enclosed shoe gear. Pain is relieved with periodic professional debridement. Painful preulcerative lesion(s) is/are aggravated when weightbearing with and without shoegear. Pain is relieved with periodic professional debridement.  Chief Complaint  Patient presents with   Nail Problem    DFC BS-did not check today A1C-6.8 PCP-Shaw PCP VST-09/2022   New problem(s): None.   PCP is [name], MD.  Allergies  Allergen Reactions   Codeine Nausea And Vomiting    Review of Systems: Negative except as noted in the HPI.  Objective:  Vitals:   05/14/23 0810  BP: (!) 132/59   NASRO Crystal Dudley is a pleasant 52 y.o. female obese in NAD. AAO x 3.  Vascular Examination: CFT <3 seconds b/l LE. Palpable pedal pulses b/l LE. Pedal hair sparse. No pain with calf compression b/l. Lower extremity skin temperature gradient within normal limits. Trace edema noted BLE. No ischemia or gangrene noted b/l LE. No cyanosis or clubbing noted b/l LE.  Dermatological Examination: Pedal skin is warm and supple b/l LE. No open wounds b/l LE. No interdigital macerations noted b/l LE.   Toenails 2-5 bilaterally and left great toe elongated, discolored, dystrophic, thickened, and crumbly with subungual debris and tenderness to dorsal palpation.   Hyperkeratotic lesion(s) R 2nd toe, and submet head 1 left foot and left 5th toe.  No erythema, no edema, no drainage, no fluctuance.   Preulcerative lesion noted submet head 1 right foot. There is visible subdermal hemorrhage. There is no surrounding  erythema, no edema, no drainage, no odor, no fluctuance.  Neurological Examination: Pt has subjective symptoms of neuropathy. Protective sensation intact 5/5 intact bilaterally with 10g monofilament b/l. Vibratory sensation intact b/l. Proprioception intact bilaterally.  Musculoskeletal Examination: Normal muscle strength 5/5 to all lower extremity muscle groups bilaterally. Lower extremity amputation(s): partial amputation of right great toe.Marland Kitchen No pain, crepitus or joint limitation noted with ROM b/l LE.  Patient ambulates independently without assistive aids.  Assessment/Plan: 1. Pain due to onychomycosis of toenails of both feet   2. Corns and callosities   3. Pre-ulcerative calluses [L84]   4. History of partial amputation of toe of right foot (HCC)     -Consent given for treatment as described below: -Examined patient. -Mycotic toenails 2-5 bilaterally and L hallux were debrided in length and girth with sterile nail nippers and dremel without iatrogenic bleeding. -Corn(s) L 5th toe pared utilizing sterile scalpel blade without complication or incident. Total number debrided=1. -Callus(es) right second digit and submet head 1 left foot pared utilizing sterile scalpel blade without complication or incident. Total number debrided =2. -Preulcerative lesion pared submet head 1 right foot utilizing sterile scalpel blade. Total number pared=1. -Patient/POA to call should there be question/concern in the interim.   Return in about 9 weeks (around 07/16/2023).  [name], DPM

## 2023-06-03 DIAGNOSIS — E1165 Type 2 diabetes mellitus with hyperglycemia: Secondary | ICD-10-CM | POA: Diagnosis not present

## 2023-06-03 DIAGNOSIS — Z794 Long term (current) use of insulin: Secondary | ICD-10-CM | POA: Diagnosis not present

## 2023-06-03 DIAGNOSIS — E1169 Type 2 diabetes mellitus with other specified complication: Secondary | ICD-10-CM | POA: Diagnosis not present

## 2023-06-17 DIAGNOSIS — E1142 Type 2 diabetes mellitus with diabetic polyneuropathy: Secondary | ICD-10-CM | POA: Diagnosis not present

## 2023-06-17 DIAGNOSIS — E1169 Type 2 diabetes mellitus with other specified complication: Secondary | ICD-10-CM | POA: Diagnosis not present

## 2023-06-17 DIAGNOSIS — E113293 Type 2 diabetes mellitus with mild nonproliferative diabetic retinopathy without macular edema, bilateral: Secondary | ICD-10-CM | POA: Diagnosis not present

## 2023-06-17 DIAGNOSIS — Z794 Long term (current) use of insulin: Secondary | ICD-10-CM | POA: Diagnosis not present

## 2023-06-17 DIAGNOSIS — E1165 Type 2 diabetes mellitus with hyperglycemia: Secondary | ICD-10-CM | POA: Diagnosis not present

## 2023-07-16 ENCOUNTER — Ambulatory Visit (INDEPENDENT_AMBULATORY_CARE_PROVIDER_SITE_OTHER): Payer: BC Managed Care – PPO | Admitting: Podiatry

## 2023-07-16 DIAGNOSIS — Z89421 Acquired absence of other right toe(s): Secondary | ICD-10-CM | POA: Diagnosis not present

## 2023-07-16 DIAGNOSIS — E1142 Type 2 diabetes mellitus with diabetic polyneuropathy: Secondary | ICD-10-CM

## 2023-07-16 DIAGNOSIS — B351 Tinea unguium: Secondary | ICD-10-CM | POA: Diagnosis not present

## 2023-07-16 DIAGNOSIS — M79674 Pain in right toe(s): Secondary | ICD-10-CM

## 2023-07-16 DIAGNOSIS — L84 Corns and callosities: Secondary | ICD-10-CM | POA: Diagnosis not present

## 2023-07-16 DIAGNOSIS — M79675 Pain in left toe(s): Secondary | ICD-10-CM

## 2023-07-16 NOTE — Progress Notes (Signed)
  Subjective:  Patient ID: Crystal Dudley, female    DOB: October 31, 1971,  MRN: [id number]  Crystal Dudley presents to clinic today for at risk foot care. Patient has h/o NIDDM, neuropathy with amputation of partial amputation of right great toe and preulcerative lesion(s) both feet and painful mycotic toenails that limit ambulation. Painful toenails interfere with ambulation. Aggravating factors include wearing enclosed shoe gear. Pain is relieved with periodic professional debridement. Painful preulcerative lesion(s) is/are aggravated when weightbearing with and without shoegear. Pain is relieved with periodic professional debridement.  Chief Complaint  Patient presents with   Diabetes    DFC BS - DIDN'T CHECK IT THIS MORNING A1C - 7.2 LVPCP - 09/2022   Callouses    CALLUS BILAT   New problem(s): None.   PCP is [name], MD.  Allergies  Allergen Reactions   Codeine Nausea And Vomiting    Review of Systems: Negative except as noted in the HPI.  Objective: No changes noted in today's physical examination. There were no vitals filed for this visit. Crystal Dudley is a pleasant 52 y.o. female in NAD. AAO x 3.  Vascular Examination: Capillary refill time immediate b/l. Vascular status intact b/l with palpable pedal pulses. Pedal hair present b/l. No pain with calf compression b/l. Skin temperature gradient WNL b/l. No cyanosis or clubbing b/l. No ischemia or gangrene noted b/l. Trace edema noted BLE.  Neurological Examination: Sensation grossly intact b/l with 10 gram monofilament. Vibratory sensation intact b/l. Pt has subjective symptoms of neuropathy.  Dermatological Examination: Pedal skin with normal turgor, texture and tone b/l.  No open wounds. No interdigital macerations.   Toenails left great toe and  2-5 b/l thick, discolored, elongated with subungual debris and pain on dorsal palpation.   Hyperkeratotic lesion(s) R 2nd toe, and submet head 1 left foot.  No  erythema, no edema, no drainage, no fluctuance.   Preulcerative lesion noted submet head 1 right foot. There is visible subdermal hemorrhage. There is no surrounding erythema, no edema, no drainage, no odor, no fluctuance.  Musculoskeletal Examination: Normal muscle strength 5/5 to all lower extremity muscle groups bilaterally. Lower extremity amputation(s): partial amputation of right great toe. No pain, crepitus or joint limitation noted with ROM b/l LE.  Patient ambulates independently without assistive aids.  Radiographs: None  Assessment/Plan: 1. Pain due to onychomycosis of toenails of both feet   2. Pre-ulcerative calluses [L84]   3. 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 DM type 2 with diabetic peripheral neuropathy (HCC)    -Patient was evaluated and treated. All patient's and/or POA's questions/concerns answered on today's visit. -Continue foot and shoe inspections daily. Monitor blood glucose per PCP/Endocrinologist's recommendations. -Mycotic toenails 2-5 bilaterally and left great toe were debrided in length and girth with sterile nail nippers and dremel without iatrogenic bleeding. -Callus(es) R 2nd toe and submet head 1 left foot pared utilizing rotary bur without complication or incident. Total number pared =2. -Preulcerative lesion pared submet head 2 right foot utilizing sterile scalpel blade. Total number pared=1. -Patient/POA to call should there be question/concern in the interim.   Return in about 9 weeks (around 09/17/2023).  [name], DPM

## 2023-07-22 ENCOUNTER — Encounter: Payer: Self-pay | Admitting: Podiatry

## 2023-09-09 DIAGNOSIS — E1142 Type 2 diabetes mellitus with diabetic polyneuropathy: Secondary | ICD-10-CM | POA: Diagnosis not present

## 2023-09-09 DIAGNOSIS — E1169 Type 2 diabetes mellitus with other specified complication: Secondary | ICD-10-CM | POA: Diagnosis not present

## 2023-09-09 DIAGNOSIS — E1159 Type 2 diabetes mellitus with other circulatory complications: Secondary | ICD-10-CM | POA: Diagnosis not present

## 2023-09-09 DIAGNOSIS — E1165 Type 2 diabetes mellitus with hyperglycemia: Secondary | ICD-10-CM | POA: Diagnosis not present

## 2023-09-16 DIAGNOSIS — E1165 Type 2 diabetes mellitus with hyperglycemia: Secondary | ICD-10-CM | POA: Diagnosis not present

## 2023-09-16 DIAGNOSIS — E1169 Type 2 diabetes mellitus with other specified complication: Secondary | ICD-10-CM | POA: Diagnosis not present

## 2023-09-16 DIAGNOSIS — Z794 Long term (current) use of insulin: Secondary | ICD-10-CM | POA: Diagnosis not present

## 2023-09-16 DIAGNOSIS — E11649 Type 2 diabetes mellitus with hypoglycemia without coma: Secondary | ICD-10-CM | POA: Diagnosis not present

## 2023-09-17 ENCOUNTER — Ambulatory Visit: Payer: BC Managed Care – PPO | Admitting: Podiatry

## 2023-09-24 ENCOUNTER — Encounter: Payer: Self-pay | Admitting: Podiatry

## 2023-09-24 ENCOUNTER — Ambulatory Visit (INDEPENDENT_AMBULATORY_CARE_PROVIDER_SITE_OTHER): Payer: BC Managed Care – PPO | Admitting: Podiatry

## 2023-09-24 DIAGNOSIS — M79675 Pain in left toe(s): Secondary | ICD-10-CM

## 2023-09-24 DIAGNOSIS — M79674 Pain in right toe(s): Secondary | ICD-10-CM

## 2023-09-24 DIAGNOSIS — L84 Corns and callosities: Secondary | ICD-10-CM

## 2023-09-24 DIAGNOSIS — Z89421 Acquired absence of other right toe(s): Secondary | ICD-10-CM

## 2023-09-24 DIAGNOSIS — B351 Tinea unguium: Secondary | ICD-10-CM

## 2023-09-24 DIAGNOSIS — E1142 Type 2 diabetes mellitus with diabetic polyneuropathy: Secondary | ICD-10-CM | POA: Diagnosis not present

## 2023-09-28 NOTE — Progress Notes (Signed)
Subjective:  Patient ID: Crystal Dudley, female    DOB: 11-22-1971,  MRN: [id number]  Crystal Dudley presents to clinic today for: at risk foot care. Patient has h/o NIDDM, neuropathy with amputation of partial amputation of R hallux  Chief Complaint  Patient presents with   Diabetes    DFC  BS- 120 A1C-6.8 PCV-09/2022     PCP is [name], MD.  Allergies  Allergen Reactions   Codeine Nausea And Vomiting    Review of Systems: Negative except as noted in the HPI.  Objective: No changes noted in today's physical examination. There were no vitals filed for this visit.  [name] is a pleasant 52 y.o. female in NAD. AAO x 3.  Vascular Examination: Capillary refill time <3 seconds b/l LE. Palpable pedal pulses b/l LE. Digital hair present b/l. No pedal edema b/l. Skin temperature gradient WNL b/l. No varicosities b/l. No ischemia or gangrene noted b/l LE. No cyanosis or clubbing noted b/l LE.Crystal Dudley  Dermatological Examination: Pedal skin with normal turgor, texture and tone b/l. No open wounds. No interdigital macerations b/l.   Toenails 2-5 bilaterally and L hallux elongated, discolored, dystrophic, thickened, and crumbly with subungual debris and tenderness to dorsal palpation. Hyperkeratotic lesion(s) R 2nd toe and submet head 1 left foot.  No erythema, no edema, no drainage, no fluctuance. Preulcerative lesion noted submet head 1 right foot. There is visible subdermal hemorrhage. There is no surrounding erythema, no edema, no drainage, no odor, no fluctuance..  Neurological Examination: Protective sensation intact with 10 gram monofilament b/l LE. Vibratory sensation intact b/l LE. Pt has subjective symptoms of neuropathy.  Musculoskeletal Examination: Muscle strength 5/5 to all lower extremity muscle groups bilaterally. Lower extremity amputation(s): partial amputation of R hallux. Patient ambulates independent of any assistive aids.  Assessment/Plan: 1. Pain  due to onychomycosis of toenails of both feet   2. Pre-ulcerative calluses [L84]   3. 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 DM type 2 with diabetic peripheral neuropathy (HCC)    -Consent given for treatment as described below: -Examined patient. -She may schedule appointment for casting for new diabetic shoes after the New Year. -Continue foot and shoe inspections daily. Monitor blood glucose per PCP/Endocrinologist's recommendations. -Continue diabetic shoes daily. -Toenails 2-5 bilaterally and L hallux debrided in length and girth without iatrogenic bleeding with sterile nail nipper and dremel.  -Callus(es) R 2nd toe and submet head 1 left foot pared utilizing sterile scalpel blade without complication or incident. Total number debrided =2. -Preulcerative lesion pared submet head 1 right foot utilizing sterile scalpel blade. Total number pared=1. -Patient/POA to call should there be question/concern in the interim.   Return in about 9 weeks (around 11/26/2023).  Crystal Dudley, DPM

## 2023-10-16 DIAGNOSIS — Z23 Encounter for immunization: Secondary | ICD-10-CM | POA: Diagnosis not present

## 2023-10-16 DIAGNOSIS — Z124 Encounter for screening for malignant neoplasm of cervix: Secondary | ICD-10-CM | POA: Diagnosis not present

## 2023-10-16 DIAGNOSIS — Z0000 Encounter for general adult medical examination without abnormal findings: Secondary | ICD-10-CM | POA: Diagnosis not present

## 2023-11-19 DIAGNOSIS — R300 Dysuria: Secondary | ICD-10-CM | POA: Diagnosis not present

## 2023-11-25 ENCOUNTER — Ambulatory Visit (INDEPENDENT_AMBULATORY_CARE_PROVIDER_SITE_OTHER): Payer: BC Managed Care – PPO | Admitting: Podiatry

## 2023-11-25 ENCOUNTER — Encounter: Payer: Self-pay | Admitting: Podiatry

## 2023-11-25 DIAGNOSIS — B351 Tinea unguium: Secondary | ICD-10-CM | POA: Diagnosis not present

## 2023-11-25 DIAGNOSIS — M79674 Pain in right toe(s): Secondary | ICD-10-CM

## 2023-11-25 DIAGNOSIS — L84 Corns and callosities: Secondary | ICD-10-CM

## 2023-11-25 DIAGNOSIS — M79675 Pain in left toe(s): Secondary | ICD-10-CM | POA: Diagnosis not present

## 2023-11-25 DIAGNOSIS — E1142 Type 2 diabetes mellitus with diabetic polyneuropathy: Secondary | ICD-10-CM

## 2023-11-25 DIAGNOSIS — Z89421 Acquired absence of other right toe(s): Secondary | ICD-10-CM

## 2023-11-25 NOTE — Progress Notes (Signed)
  Subjective:  Patient ID: Crystal Dudley, female    DOB: 1971-06-13,  MRN: [id number]  Crystal Dudley presents to clinic today for at risk foot care. Patient has h/o NIDDM, neuropathy with amputation of partial amputation of right great toe. Chief Complaint  Patient presents with   Foot Pain    Seen Dr. [name]  seen 10/24.  On ABT for UTI last night started dose.    Diabetes    A1c 7.1-- 3 months ago, Endocrinology monitors.   New problem(s): None.   PCP is [name], MD.  Allergies  Allergen Reactions   Codeine Nausea And Vomiting    Review of Systems: Negative except as noted in the HPI.  Objective:  There were no vitals filed for this visit. Crystal Dudley is a pleasant 53 y.o. female in NAD. AAO x 3.  Vascular Examination: Capillary refill time <3 seconds b/l LE. Palpable pedal pulses b/l LE. Digital hair present b/l. No pedal edema b/l. Skin temperature gradient WNL b/l. No varicosities b/l. No ischemia or gangrene noted b/l LE. No cyanosis or clubbing noted b/l LE.Marland Kitchen  Dermatological Examination: Pedal skin with normal turgor, texture and tone b/l. No open wounds. No interdigital macerations b/l.   Toenails 2-5 bilaterally and L hallux elongated, discolored, dystrophic, thickened, and crumbly with subungual debris and tenderness to dorsal palpation.   Hyperkeratotic lesion(s) right great toe, R 2nd toe, submet head 5 left foot, submet head 1 left foot, submet head 1 right foot.  There is no surrounding erythema, no edema, no drainage, no odor, no fluctuance.  Neurological Examination: Protective sensation intact with 10 gram monofilament b/l LE. Vibratory sensation intact b/l LE. Pt has subjective symptoms of neuropathy.  Musculoskeletal Examination: Muscle strength 5/5 to all lower extremity muscle groups bilaterally. Lower extremity amputation(s): partial amputation of R hallux. Patient ambulates independent of any assistive  aids.  Assessment/Plan: 1. Pain due to onychomycosis of toenails of both feet   2. Pre-ulcerative calluses [L84]   3. 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 DM type 2 with diabetic peripheral neuropathy [facility])     -Patient was evaluated today. All questions/concerns addressed on today's visit. -Continue foot and shoe inspections daily. Monitor blood glucose per PCP/Endocrinologist's recommendations. -Continue diabetic shoes daily. -Mycotic toenails 2-5 bilaterally and left great toe were debrided in length and girth with sterile nail nippers and dremel without iatrogenic bleeding. -Callus(es) right great toe, R 2nd toe, submet head 1 b/l, and submet head 5 left foot pared utilizing sharp debridement with sterile blade without complication or incident. Total number debrided =5. -Patient/POA to call should there be question/concern in the interim.   Return in about 3 months (around 02/23/2024).  Crystal Dudley, DPM      [place] LOCATION: [address], [place] [address]                   Office [phone]   [facility] LOCATION: [address] [place], [place] [address] Office [phone]

## 2024-02-03 ENCOUNTER — Ambulatory Visit: Payer: BC Managed Care – PPO | Admitting: Podiatry

## 2024-02-12 ENCOUNTER — Ambulatory Visit: Payer: BC Managed Care – PPO | Admitting: Podiatry

## 2024-03-23 ENCOUNTER — Ambulatory Visit: Payer: BC Managed Care – PPO | Admitting: Podiatry

## 2024-04-06 ENCOUNTER — Ambulatory Visit (INDEPENDENT_AMBULATORY_CARE_PROVIDER_SITE_OTHER): Payer: BC Managed Care – PPO | Admitting: Podiatry

## 2024-04-06 ENCOUNTER — Encounter: Payer: Self-pay | Admitting: Podiatry

## 2024-04-06 VITALS — Ht 66.0 in | Wt 240.0 lb

## 2024-04-06 DIAGNOSIS — E1142 Type 2 diabetes mellitus with diabetic polyneuropathy: Secondary | ICD-10-CM

## 2024-04-06 DIAGNOSIS — E119 Type 2 diabetes mellitus without complications: Secondary | ICD-10-CM

## 2024-04-06 DIAGNOSIS — B351 Tinea unguium: Secondary | ICD-10-CM | POA: Diagnosis not present

## 2024-04-06 DIAGNOSIS — M79675 Pain in left toe(s): Secondary | ICD-10-CM | POA: Diagnosis not present

## 2024-04-06 DIAGNOSIS — L84 Corns and callosities: Secondary | ICD-10-CM

## 2024-04-06 DIAGNOSIS — M79674 Pain in right toe(s): Secondary | ICD-10-CM | POA: Diagnosis not present

## 2024-04-06 DIAGNOSIS — Z89421 Acquired absence of other right toe(s): Secondary | ICD-10-CM

## 2024-04-06 NOTE — Progress Notes (Signed)
 ANNUAL DIABETIC FOOT EXAM  Subjective: Crystal Dudley presents today for annual diabetic foot exam. Chief Complaint  Patient presents with   Nail Problem    Pt is here for [facility] last A1C was 7.1 PCP is Dr Adolm Ahumada and LOV was in October.   Patient confirms h/o diabetes.  Patient has h/o amputation(s): partial amputation of right great toe.  Patient has been diagnosed with neuropathy.  Glena Landau, MD is patient's PCP.  Past Medical History:  Diagnosis Date   Diabetes mellitus without complication (HCC)    Type II   Patient Active Problem List   Diagnosis Date Noted   Background diabetic retinopathy (HCC) 07/05/2022   Diabetic foot ulcer (HCC) 02/27/2021   Family history of colonic polyps 02/27/2021   Leukocytosis 02/27/2021   Mixed hyperlipidemia 02/27/2021   Amputation of toe (HCC) 12/19/2020   Body mass index (BMI) 40.0-44.9, adult (HCC) 12/19/2020   Chronic kidney disease due to hypertension 12/19/2020   Colon cancer screening 12/19/2020   Vitamin B12 deficiency 05/20/2019   History of partial amputation of toe of right foot (HCC) 11/01/2017   Severe obesity (HCC) 04/14/2017   Vitamin D deficiency 04/14/2017   Type 2 diabetes mellitus with morbid obesity (HCC) 04/14/2017   Type 2 diabetes mellitus treated with insulin  (HCC) 04/14/2017   Diabetes (HCC) 02/24/2014   Essential hypertension 02/24/2014   Dyslipidemia 02/24/2014   Past Surgical History:  Procedure Laterality Date   AMPUTATION TOE Right 09/05/2017   Procedure: AMPUTATION TOE;  Surgeon: Camilo Cella, DPM;  Location: [facility] OR;  Service: Podiatry;  Laterality: Right;   Current Outpatient Medications on File Prior to Visit  Medication Sig Dispense Refill   aspirin 81 MG EC tablet 1 tablet     azelastine (ASTELIN) 0.1 % nasal spray USE 2 SPRAY(S) IN EACH NOSTRIL TWICE DAILY     Cholecalciferol (VITAMIN D3) 5000 units CAPS Take 5,000 Units by mouth daily.     Cranberry-Vitamin C (AZO CRANBERRY URINARY  TRACT PO) Take 1 tablet by mouth daily.     Cranberry-Vitamin C-Probiotic (AZO CRANBERRY) 250-30 MG TABS      cyanocobalamin  1000 MCG tablet Take by mouth.     fluticasone (FLONASE ALLERGY RELIEF) 50 MCG/ACT nasal spray 1 spray in each nostril     hydrochlorothiazide (HYDRODIURIL) 25 MG tablet Take 25 mg by mouth daily.     HYDROcodone  bit-homatropine (HYCODAN) 5-1.5 MG/5ML syrup Take 5 mLs by mouth every 6 (six) hours as needed.     HYDROcodone -acetaminophen  (NORCO/VICODIN) 5-325 MG tablet Take 1 tablet by mouth every 4 (four) hours as needed for moderate pain. 20 tablet 0   ketoconazole (NIZORAL) 2 % cream 1 application to affected area     lisinopril (PRINIVIL,ZESTRIL) 40 MG tablet Take 40 mg by mouth daily.      loratadine (CLARITIN) 10 MG tablet Take 10 mg by mouth daily.     Magnesium 250 MG TABS Take 250 mg by mouth daily.     metFORMIN (GLUCOPHAGE-XR) 500 MG 24 hr tablet 4 tablets with evening meal     MOUNJARO 12.5 MG/0.5ML Pen Inject into the skin.     ONETOUCH VERIO test strip      pioglitazone (ACTOS) 15 MG tablet Take 15 mg by mouth daily.      polyethylene glycol-electrolytes (NULYTELY) 420 g solution Take by mouth as directed.     pseudoephedrine (SUDAFED) 30 MG tablet 2 tablets as needed     simvastatin (ZOCOR) 40 MG tablet  Take 40 mg by mouth daily.      sulfamethoxazole-trimethoprim (BACTRIM DS) 800-160 MG tablet Take 1 tablet by mouth.     TRESIBA FLEXTOUCH 200 UNIT/ML SOPN Inject 80 Units into the skin daily.      TRULICITY 4.5 MG/0.5ML SOPN Inject into the skin.     No current facility-administered medications on file prior to visit.    Allergies  Allergen Reactions   Codeine Nausea And Vomiting   Social History   Occupational History   Not on file  Tobacco Use   Smoking status: Former    Current packs/day: 0.00    Types: Cigarettes    Start date: 10/1997    Quit date: 10/2006    Years since quitting: 17.4   Smokeless tobacco: Never  Vaping Use   Vaping  status: Never Used  Substance and Sexual Activity   Alcohol use: Yes    Comment: occ   Drug use: No   Sexual activity: Not on file   Family History  Problem Relation Age of Onset   Breast cancer Neg Hx    Immunization History  Administered Date(s) Administered   Influenza Split 10/08/2012, 10/22/2013, 11/04/2014, 10/24/2015, 09/16/2016, 08/26/2017, 08/31/2018, 09/30/2020   Influenza,inj,Quad PF,6+ Mos 08/26/2017, 08/31/2018   Influenza,inj,Quad PF,6-35 Mos 08/24/2019   Influenza,inj,quad, With Preservative 10/08/2012, 11/04/2014   Janssen (J&J) SARS-COV-2 Vaccination 03/02/2020   Moderna Sars-Covid-2 Vaccination 11/03/2020   Pneumococcal Polysaccharide-23 08/31/2012   Td 10/02/2001   Tdap 08/31/2012     Review of Systems: Negative except as noted in the HPI.   Objective: There were no vitals filed for this visit.  Crystal Dudley is a pleasant 53 y.o. female in NAD. AAO X 3.  Diabetic foot exam was performed with the following findings:   Pt has subjective symptoms of neuropathy. Vibratory sensation intact b/l. Proprioception intact bilaterally.  Toenails 2-5 bilaterally and left great toe well maintained with adequate length. No erythema, no edema, no drainage, no fluctuance. Preulcerative lesion noted R hallux right 2nd toe, submet head 1 b/l, and submet head 5 left foot. There is visible subdermal hemorrhage. There is no surrounding erythema, no edema, no drainage, no odor, no fluctuance.  Muscle strength 5/5 to all lower extremity muscle groups bilaterally. Lower extremity amputation(s): partial amputation of right great toe.  Lab Results  Component Value Date   HGBA1C 6.6 (H) 09/05/2017   ADA Risk Categorization:  Pedal pulses present High Risk  Patient has one or more of the following: Loss of protective sensation Absent pedal pulses Severe Foot deformity History of foot ulcer  Assessment: 1. Pain due to onychomycosis of toenails of both feet   2.  Pre-ulcerative calluses [L84]   3. 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 DM type 2 with diabetic peripheral neuropathy (HCC)   5. Encounter for diabetic foot exam (HCC)     Plan: -Consent given for treatment as described below: -Examined patient. -Diabetic foot examination performed today. -Continue diabetic shoes daily. -Toenails 2-5 bilaterally and L hallux were debrided in length and girth with sterile nail nippers without iatrogenic bleeding.  -Preulcerative lesion pared R hallux, R 2nd toe, submet head 1 b/l, and submet head 5 left foot utilizing sterile scalpel blade. Total number pared=5. -Patient/POA to call should there be question/concern in the interim. Return in about 3 months (around 07/06/2024).  Luella Sager, DPM      [place] LOCATION: [address]  [place], [place] [address]                   Office [phone]   [facility] LOCATION: [address] [place], [place] [address] Office [phone]

## 2024-06-08 ENCOUNTER — Encounter: Payer: Self-pay | Admitting: Podiatry

## 2024-06-08 ENCOUNTER — Ambulatory Visit (INDEPENDENT_AMBULATORY_CARE_PROVIDER_SITE_OTHER): Payer: BC Managed Care – PPO | Admitting: Podiatry

## 2024-06-08 VITALS — Ht 66.0 in | Wt 240.0 lb

## 2024-06-08 DIAGNOSIS — M79674 Pain in right toe(s): Secondary | ICD-10-CM

## 2024-06-08 DIAGNOSIS — M79675 Pain in left toe(s): Secondary | ICD-10-CM | POA: Diagnosis not present

## 2024-06-08 DIAGNOSIS — B351 Tinea unguium: Secondary | ICD-10-CM | POA: Diagnosis not present

## 2024-06-08 DIAGNOSIS — Z89421 Acquired absence of other right toe(s): Secondary | ICD-10-CM | POA: Diagnosis not present

## 2024-06-08 DIAGNOSIS — E1142 Type 2 diabetes mellitus with diabetic polyneuropathy: Secondary | ICD-10-CM | POA: Diagnosis not present

## 2024-06-08 DIAGNOSIS — L84 Corns and callosities: Secondary | ICD-10-CM | POA: Diagnosis not present

## 2024-06-13 NOTE — Progress Notes (Signed)
 Subjective:  Patient ID: Crystal Dudley, female    DOB: 1971-07-01,  MRN: [id number]  Crystal Dudley presents to clinic today for at risk foot care. Patient has h/o NIDDM, neuropathy with amputation of partial amputation of R hallux and preulcerative lesion(s) of both feet and painful mycotic toenails that limit ambulation. Painful toenails interfere with ambulation. Aggravating factors include wearing enclosed shoe gear. Pain is relieved with periodic professional debridement. Painful preulcerative lesion(s) is/are aggravated when weightbearing with and without shoegear. Pain is relieved with periodic professional debridement.  Chief Complaint  Patient presents with   Nail Problem    Pt is here for [facility] last A1C was 6.4 PCP is Dr Loreli and LOV was in  May.   New problem(s): None.   PCP is Loreli Kins, MD.  Allergies  Allergen Reactions   Codeine Nausea And Vomiting    Review of Systems: Negative except as noted in the HPI.  Objective: No changes noted in today's physical examination. There were no vitals filed for this visit. Crystal Dudley is a pleasant 53 y.o. female in NAD. AAO x 3.  Vascular Examination: Capillary refill time immediate b/l. Palpable pedal pulses. Pedal hair absent b/l. No pain with calf compression b/l. Skin temperature gradient WNL b/l. No cyanosis or clubbing b/l. No ischemia or gangrene noted b/l. No edema noted b/l LE.  Neurological Examination: Sensation grossly intact b/l with 10 gram monofilament. Vibratory sensation intact b/l. Pt has subjective symptoms of neuropathy.  Dermatological Examination: Pedal skin with normal turgor, texture and tone b/l.  No open wounds. No interdigital macerations.   Toenails left great toe and 2-5 b/l thick, discolored, elongated with subungual debris and pain on dorsal palpation.   Preulcerative lesion noted plantar proximal phalanx right foot, plantar middle phalanx right 2nd digit, submet head 1 left foot,  submet head 1 right foot, and submet head 5 left foot. There is visible subdermal hemorrhage. There is no surrounding erythema, no edema, no drainage, no odor, no fluctuance.  Musculoskeletal Examination: Muscle strength 5/5 to all lower extremity muscle groups bilaterally. Lower extremity amputation(s): partial amputation of R hallux.. No pain, crepitus or joint limitation noted with ROM b/l LE.  Patient ambulates independently without assistive aids.  Radiographs: None  Assessment/Plan: 1. Pain due to onychomycosis of toenails of both feet   2. Pre-ulcerative calluses [L84]   3. 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 DM type 2 with diabetic peripheral neuropathy [facility])   Consent given for treatment. Diabetic foot examination performed. All patient's and/or POA's questions/concerns addressed on today's visit. Toenails left hallux and 2-5 b/l debrided in length and girth without incident. Preulcerative callus(es) plantar proximal phalanx right great toe, plantar middle phalanx right 2nd toe, submet head 1 b/l, and submet head 5 left foot pared with sharp debridement without incident. Continue foot and shoe inspections daily. Monitor blood glucose per PCP/Endocrinologist's recommendations.Continue soft, supportive shoe gear daily. Report any pedal injuries to medical professional. Call office if there are any quesitons/concerns. -Patient/POA to call should there be question/concern in the interim.   Return in about 9 weeks (around 08/10/2024).  Crystal Dudley, DPM      [place] LOCATION: [address]  [place], [place] [address]                   Office [phone]   [facility] LOCATION: [address] [place], [place] [address] Office [phone]

## 2024-08-10 ENCOUNTER — Ambulatory Visit (INDEPENDENT_AMBULATORY_CARE_PROVIDER_SITE_OTHER): Admitting: Podiatry

## 2024-08-10 ENCOUNTER — Encounter: Payer: Self-pay | Admitting: Podiatry

## 2024-08-10 DIAGNOSIS — M79674 Pain in right toe(s): Secondary | ICD-10-CM

## 2024-08-10 DIAGNOSIS — Z89421 Acquired absence of other right toe(s): Secondary | ICD-10-CM

## 2024-08-10 DIAGNOSIS — M79675 Pain in left toe(s): Secondary | ICD-10-CM

## 2024-08-10 DIAGNOSIS — L84 Corns and callosities: Secondary | ICD-10-CM | POA: Diagnosis not present

## 2024-08-10 DIAGNOSIS — B351 Tinea unguium: Secondary | ICD-10-CM | POA: Diagnosis not present

## 2024-08-10 DIAGNOSIS — E1142 Type 2 diabetes mellitus with diabetic polyneuropathy: Secondary | ICD-10-CM | POA: Diagnosis not present

## 2024-08-10 NOTE — Progress Notes (Signed)
 Subjective:  Patient ID: Crystal Dudley, female    DOB: 1971-10-02,  MRN: [id number]  Crystal Dudley presents to clinic today for at risk foot care. Patient has h/o NIDDM with amputation of partial amputation of right great toe and preulcerative lesion(s) of both feet and painful mycotic toenails that limit ambulation. Painful toenails interfere with ambulation. Aggravating factors include wearing enclosed shoe gear. Pain is relieved with periodic professional debridement. Painful preulcerative lesion(s) is/are aggravated when weightbearing with and without shoegear. Pain is relieved with periodic professional debridement.  Chief Complaint  Patient presents with   Dfc    Rm15 Diabetic foot care/ A1C 6.8/ Dr. Loreli last visit October 2024   New problem(s): None.   PCP is Crystal Kins, MD.  Allergies  Allergen Reactions   Codeine Nausea And Vomiting    Review of Systems: Negative except as noted in the HPI.  Objective: No changes noted in today's physical examination. There were no vitals filed for this visit. Crystal Dudley is a pleasant 53 y.o. female obese in NAD. AAO x 3.  Vascular Examination: Capillary refill time immediate b/l. Palpable pedal pulses. Pedal hair absent b/l. No pain with calf compression b/l. Skin temperature gradient WNL b/l. No cyanosis or clubbing b/l. No ischemia or gangrene noted b/l. No edema noted b/l LE.  Neurological Examination: Sensation grossly intact b/l with 10 gram monofilament. Vibratory sensation intact b/l. Pt has subjective symptoms of neuropathy.  Dermatological Examination: Pedal skin with normal turgor, texture and tone b/l.  No open wounds. No interdigital macerations.   Toenails left great toe and 2-5 b/l thick, discolored, elongated with subungual debris and pain on dorsal palpation.   Preulcerative lesion noted plantar proximal phalanx right foot, plantar middle phalanx right 2nd digit, submet head 1 left foot, submet head 1  right foot, and submet head 5 left foot. There is visible subdermal hemorrhage. There is no surrounding erythema, no edema, no drainage, no odor, no fluctuance.  Musculoskeletal Examination: Muscle strength 5/5 to all lower extremity muscle groups bilaterally. Lower extremity amputation(s): partial amputation of R hallux. No pain, crepitus or joint limitation noted with ROM b/l LE.  Patient ambulates independently without assistive aids.  Radiographs: None  Assessment/Plan: 1. Pain due to onychomycosis of toenails of both feet   2. Pre-ulcerative calluses [L84]   3. 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 DM type 2 with diabetic peripheral neuropathy [facility])   -Patient was evaluated today. All questions/concerns addressed on today's visit. -Continue foot and shoe inspections daily. Monitor blood glucose per PCP/Endocrinologist's recommendations. -Continue diabetic shoes daily. -Mycotic toenails submet head 1 b/l were debrided in length and girth with sterile nail nippers and dremel without iatrogenic bleeding. -Preulcerative lesion pared plantar proximal phalanx right hallux, plantar IPJ right 2nd toe, submet head 1 b/l, and sub 5th met base left foot utilizing sterile scalpel blade. Total number pared=5. -Offloaded callus(es)/porokeratotic lesion(s) submet head 1 b/l with felt dancers pad applied to insole(s) of shoe. -Patient/POA to call should there be question/concern in the interim..   No follow-ups on file.  Crystal LITTIE Merlin, DPM      [place] LOCATION: [address]                                                 [place], [place]  [address]                   Office [phone]   [facility] LOCATION: [address] [place], [place] [address] Office [phone]

## 2024-10-12 ENCOUNTER — Encounter: Payer: Self-pay | Admitting: Podiatry

## 2024-10-12 ENCOUNTER — Ambulatory Visit: Admitting: Podiatry

## 2024-10-12 DIAGNOSIS — L84 Corns and callosities: Secondary | ICD-10-CM | POA: Diagnosis not present

## 2024-10-12 DIAGNOSIS — E1142 Type 2 diabetes mellitus with diabetic polyneuropathy: Secondary | ICD-10-CM

## 2024-10-12 DIAGNOSIS — M79674 Pain in right toe(s): Secondary | ICD-10-CM | POA: Diagnosis not present

## 2024-10-12 DIAGNOSIS — Z89421 Acquired absence of other right toe(s): Secondary | ICD-10-CM | POA: Diagnosis not present

## 2024-10-12 DIAGNOSIS — B351 Tinea unguium: Secondary | ICD-10-CM

## 2024-10-12 DIAGNOSIS — M79675 Pain in left toe(s): Secondary | ICD-10-CM

## 2024-10-17 NOTE — Progress Notes (Signed)
 Subjective:  Patient ID: Crystal Dudley, female    DOB: February 12, 1971,  MRN: [id number]  Crystal Dudley presents to clinic today for at risk foot care. Patient has h/o NIDDM with amputation of partial amputation of R hallux and preulcerative lesion(s) b/l lower extremities and painful mycotic toenails that limit ambulation. Painful toenails interfere with ambulation. Aggravating factors include wearing enclosed shoe gear. Pain is relieved with periodic professional debridement. Painful preulcerative lesion(s) is/are aggravated when weightbearing with and without shoegear. Pain is relieved with periodic professional debridement.  Chief Complaint  Patient presents with   Diabetes    Patient stated that her last A1c was 6.6 and that she last saw her last year October and her PCP name is Joen Gentry    New problem(s): None.   PCP is Gentry Joen, MD.  Allergies  Allergen Reactions   Codeine Nausea And Vomiting    Review of Systems: Negative except as noted in the HPI.  Objective: No changes noted in today's physical examination. There were no vitals filed for this visit. Crystal Dudley is a pleasant 53 y.o. female obese in NAD. AAO x 3.  Vascular Examination: Capillary refill time immediate b/l. Palpable pedal pulses. Pedal hair absent b/l. No pain with calf compression b/l. Skin temperature gradient WNL b/l. No cyanosis or clubbing b/l. No ischemia or gangrene noted b/l. No edema noted b/l LE.  Neurological Examination: Sensation grossly intact b/l with 10 gram monofilament. Vibratory sensation intact b/l. Pt has subjective symptoms of neuropathy.  Dermatological Examination: Pedal skin with normal turgor, texture and tone b/l.  No open wounds. No interdigital macerations.   Toenails left great toe and 2-5 b/l thick, discolored, elongated with subungual debris and pain on dorsal palpation.   Preulcerative lesion noted  plantar middle phalanx right 2nd digit, submet head 1  left foot, submet head 1 right foot, and submet head 5 left foot. There is visible subdermal hemorrhage. There is no surrounding erythema, no edema, no drainage, no odor, no fluctuance.  Musculoskeletal Examination: Muscle strength 5/5 to all lower extremity muscle groups bilaterally. Lower extremity amputation(s): partial amputation of R hallux. No pain, crepitus or joint limitation noted with ROM b/l LE.  Patient ambulates independently without assistive aids.  Radiographs: None  Assessment/Plan: 1. Pain due to onychomycosis of toenails of both feet   2. Pre-ulcerative calluses [L84]   3. 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 DM type 2 with diabetic peripheral neuropathy [facility])   Consent given for treatment. All patient's and/or POA's questions/concerns addressed on today's visit. Toenails left great toe and 2-5 b/l debrided in length and girth without incident. Preulcerative lesion(s) plantar proximal phalanx right 2nd toe, submet head 1 left foot, submet head 1 right foot, and sub 5th met base left foot pared with sharp debridement without incident. Continue foot and shoe inspections daily. Monitor blood glucose per PCP/Endocrinologist's recommendations.Continue soft, supportive shoe gear daily. Report any pedal injuries to medical professional. Call office if there are any quesitons/concerns. Return in about 9 weeks (around 12/14/2024).  Crystal LITTIE Merlin, DPM      [place] LOCATION: [address], [place] [address]  Office [phone]   [facility] LOCATION: [address] [place], [place] [address] Office [phone]

## 2024-10-18 ENCOUNTER — Other Ambulatory Visit (HOSPITAL_COMMUNITY): Payer: Self-pay | Admitting: Family Medicine

## 2024-10-18 ENCOUNTER — Other Ambulatory Visit: Payer: Self-pay | Admitting: Family Medicine

## 2024-10-18 DIAGNOSIS — I129 Hypertensive chronic kidney disease with stage 1 through stage 4 chronic kidney disease, or unspecified chronic kidney disease: Secondary | ICD-10-CM

## 2024-10-18 DIAGNOSIS — Z1231 Encounter for screening mammogram for malignant neoplasm of breast: Secondary | ICD-10-CM

## 2024-11-02 ENCOUNTER — Ambulatory Visit (HOSPITAL_COMMUNITY)
Admission: RE | Admit: 2024-11-02 | Discharge: 2024-11-02 | Disposition: A | Discharge status: Home / Self Care | Payer: Self-pay | Source: Ambulatory Visit | Attending: Family Medicine | Admitting: Family Medicine

## 2024-11-02 DIAGNOSIS — I129 Hypertensive chronic kidney disease with stage 1 through stage 4 chronic kidney disease, or unspecified chronic kidney disease: Secondary | ICD-10-CM

## 2024-11-11 ENCOUNTER — Ambulatory Visit
Admission: RE | Admit: 2024-11-11 | Discharge: 2024-11-11 | Disposition: A | Discharge status: Home / Self Care | Source: Ambulatory Visit | Attending: Family Medicine | Admitting: Family Medicine

## 2024-11-11 DIAGNOSIS — Z1231 Encounter for screening mammogram for malignant neoplasm of breast: Secondary | ICD-10-CM

## 2024-12-07 ENCOUNTER — Encounter: Payer: Self-pay | Admitting: Cardiovascular Disease

## 2024-12-07 ENCOUNTER — Ambulatory Visit: Admitting: Cardiovascular Disease

## 2024-12-07 VITALS — BP 122/58 | HR 85 | Ht 66.0 in | Wt 256.0 lb

## 2024-12-07 DIAGNOSIS — I2510 Atherosclerotic heart disease of native coronary artery without angina pectoris: Secondary | ICD-10-CM | POA: Insufficient documentation

## 2024-12-07 DIAGNOSIS — I10 Essential (primary) hypertension: Secondary | ICD-10-CM

## 2024-12-07 DIAGNOSIS — E785 Hyperlipidemia, unspecified: Secondary | ICD-10-CM

## 2024-12-07 NOTE — Progress Notes (Signed)
 12/07/2024 Delon JINNY Sar   1971/10/27  [id number]  Primary Physician Loreli Kins, MD Primary Cardiologist: Dorn JINNY Lesches MD GENI [place], [place]  HPI:  Crystal Dudley is a 53 y.o. moderately overweight married Caucasian female with no children who works as an government social research officer.  She was referred by her primary care provider, Dr. Loreli, for cardiovascular valuation because of elevated coronary calcium score.  Her risk factors include discontinue tobacco abuse in 2007 having smoked 5 pack years.  She does have treated hypertension, diabetes and hyperlipidemia.  Her father had a myocardial infarction at age 89 and survived 49 more years after that.  She is never had a heart attack or stroke.  She denies chest pain or shortness of breath.  She does have morbid obesity on a GLP-1 agonist having lost 40 pounds over the last 3 years.  She works out on the treadmill 2-3 times a week, 30 minutes at a time and is asymptomatic.  She did have a coronary calcium score performed 11/02/2024 which was 464 and the majority of which was in the LAD and RCA.   Active Medications[1]   Allergies[2]  Social History   Socioeconomic History   Marital status: Married    Spouse name: Not on file   Number of children: Not on file   Years of education: Not on file   Highest education level: Not on file  Occupational History   Not on file  Tobacco Use   Smoking status: Former    Current packs/day: 0.00    Types: Cigarettes    Start date: 10/1997    Quit date: 10/2006    Years since quitting: 18.1   Smokeless tobacco: Never  Vaping Use   Vaping status: Never Used  Substance and Sexual Activity   Alcohol use: Yes    Comment: occ   Drug use: No   Sexual activity: Not on file  Other Topics Concern   Not on file  Social History Narrative   Not on file   Social Drivers of Health   Tobacco Use: Medium Risk (12/07/2024)   Patient History    Smoking Tobacco Use: Former    Smokeless  Tobacco Use: Never    Passive Exposure: Not on Actuary Strain: Not on file  Food Insecurity: Low Risk (03/22/2024)   Received from [facility]   Epic    Within the past 12 months, you worried that your food would run out before you got money to buy more: Never true    Within the past 12 months, the food you bought just didn't last and you didn't have money to get more. : Never true  Transportation Needs: No Transportation Needs (03/22/2024)   Received from [facility]    In the past 12 months, has lack of reliable transportation kept you from medical appointments, meetings, work or from getting things needed for daily living? : No  Physical Activity: Not on file  Stress: Not on file  Social Connections: Not on file  Intimate Partner Violence: Not on file  Depression (EYV7-0): Not on file  Alcohol Screen: Not on file  Housing: Low Risk (03/22/2024)   Received from [facility]   Epic    What is your living situation today?: I have a steady place to live    Think about the place you live. Do you have problems with any of the following? Choose all that apply:: Not on file  Utilities: Low Risk (03/22/2024)   Received from [facility]   Utilities    In the past 12 months has the electric, gas, oil, or water company threatened to shut off services in your home? : No  Health Literacy: Not on file     Review of Systems: General: negative for chills, fever, night sweats or weight changes.  Cardiovascular: negative for chest pain, dyspnea on exertion, edema, orthopnea, palpitations, paroxysmal nocturnal dyspnea or shortness of breath Dermatological: negative for rash Respiratory: negative for cough or wheezing Urologic: negative for hematuria Abdominal: negative for nausea, vomiting, diarrhea, bright red blood per rectum, melena, or hematemesis Neurologic: negative for visual changes, syncope, or dizziness All other systems reviewed and are otherwise  negative except as noted above.    Blood pressure (!) 122/58, pulse 85, height 5' 6 (1.676 m), weight 256 lb (116.1 kg), last menstrual period 07/05/2017, SpO2 97%.  General appearance: alert and no distress Neck: no adenopathy, no carotid bruit, no JVD, supple, symmetrical, trachea midline, and thyroid  not enlarged, symmetric, no tenderness/mass/nodules Lungs: clear to auscultation bilaterally Heart: regular rate and rhythm, S1, S2 normal, no murmur, click, rub or gallop Extremities: extremities normal, atraumatic, no cyanosis or edema Pulses: 2+ and symmetric Skin: Skin color, texture, turgor normal. No rashes or lesions Neurologic: Grossly normal  EKG EKG Interpretation Date/Time:  Tuesday December 07 2024 09:39:12 EST Ventricular Rate:  86 PR Interval:  154 QRS Duration:  96 QT Interval:  354 QTC Calculation: 423 R Axis:   12  Text Interpretation: Normal sinus rhythm Normal ECG When compared with ECG of 05-Sep-2017 10:01, No significant change was found Confirmed by Court Carrier [id number]) on 12/07/2024 10:01:41 AM    ASSESSMENT AND PLAN:   Essential hypertension History of essential hypertension blood pressure measured today at 122/58.  She is on HydroDIURIL, and lisinopril.  Dyslipidemia History of dyslipidemia on statin therapy followed by her endocrinologist.  Her last lipid profile in our chart 10/10/2022 revealed total cholesterol 86, LDL 28 and HDL 32.  Her most recent lipid profile performed 2 months ago revealed total cholesterol 104, LDL 39 and HDL of 40, at goal for secondary prevention.  Severe obesity (HCC) BMI 41 on Mounjaro having lost 40 pounds over the last 3 years.  Coronary artery disease Elevated coronary calcium score of 464 performed 11/02/2024 most of which was in the LAD and RCA.  She is completely asymptomatic.  Based on this we are going to optimize her cardiovascular risk factors.  She was at goal for secondary prevention on statin  therapy.     Carrier DOROTHA Court MD FACP,FACC,FAHA, FSCAI 12/07/2024 10:19 AM     [1]  Current Meds  Medication Sig   aspirin 81 MG EC tablet 1 tablet   azelastine (ASTELIN) 0.1 % nasal spray USE 2 SPRAY(S) IN EACH NOSTRIL TWICE DAILY   Cholecalciferol (VITAMIN D3) 5000 units CAPS Take 5,000 Units by mouth daily.   Cranberry-Vitamin C (AZO CRANBERRY URINARY TRACT PO) Take 1 tablet by mouth daily.   Cranberry-Vitamin C-Probiotic (AZO CRANBERRY) 250-30 MG TABS    cyanocobalamin  1000 MCG tablet Take by mouth.   fluticasone (FLONASE ALLERGY RELIEF) 50 MCG/ACT nasal spray 1 spray in each nostril   hydrochlorothiazide (HYDRODIURIL) 25 MG tablet Take 25 mg by mouth daily.   HYDROcodone  bit-homatropine (HYCODAN) 5-1.5 MG/5ML syrup Take 5 mLs by mouth every 6 (six) hours as needed.   ketoconazole (NIZORAL) 2 % cream 1 application to affected area   lisinopril (PRINIVIL,ZESTRIL)  40 MG tablet Take 40 mg by mouth daily.    loratadine (CLARITIN) 10 MG tablet Take 10 mg by mouth daily.   Magnesium 250 MG TABS Take 250 mg by mouth daily.   metFORMIN (GLUCOPHAGE-XR) 500 MG 24 hr tablet 4 tablets with evening meal   MOUNJARO 12.5 MG/0.5ML Pen Inject into the skin.   ONETOUCH VERIO test strip    pioglitazone (ACTOS) 15 MG tablet Take 15 mg by mouth daily.    polyethylene glycol-electrolytes (NULYTELY) 420 g solution Take by mouth as directed.   pseudoephedrine (SUDAFED) 30 MG tablet 2 tablets as needed   simvastatin (ZOCOR) 40 MG tablet Take 40 mg by mouth daily.    sulfamethoxazole-trimethoprim (BACTRIM DS) 800-160 MG tablet Take 1 tablet by mouth.   TRESIBA FLEXTOUCH 200 UNIT/ML SOPN Inject 80 Units into the skin daily.    TRULICITY 4.5 MG/0.5ML SOPN Inject into the skin.  [2]  Allergies Allergen Reactions   Codeine Nausea And Vomiting

## 2024-12-07 NOTE — Assessment & Plan Note (Signed)
 Elevated coronary calcium score of 464 performed 11/02/2024 most of which was in the LAD and RCA.  She is completely asymptomatic.  Based on this we are going to optimize her cardiovascular risk factors.  She was at goal for secondary prevention on statin therapy.

## 2024-12-07 NOTE — Patient Instructions (Signed)

## 2024-12-07 NOTE — Assessment & Plan Note (Addendum)
 History of dyslipidemia on statin therapy followed by her endocrinologist.  Her last lipid profile in our chart 10/10/2022 revealed total cholesterol 86, LDL 28 and HDL 32.  Her most recent lipid profile performed 2 months ago revealed total cholesterol 104, LDL 39 and HDL of 40, at goal for secondary prevention.

## 2024-12-07 NOTE — Assessment & Plan Note (Signed)
 BMI 41 on Mounjaro having lost 40 pounds over the last 3 years.

## 2024-12-07 NOTE — Assessment & Plan Note (Signed)
 History of essential hypertension blood pressure measured today at 122/58.  She is on HydroDIURIL, and lisinopril.

## 2024-12-14 ENCOUNTER — Ambulatory Visit: Admitting: Podiatry

## 2024-12-14 ENCOUNTER — Encounter: Payer: Self-pay | Admitting: Podiatry

## 2024-12-14 DIAGNOSIS — Z89421 Acquired absence of other right toe(s): Secondary | ICD-10-CM | POA: Diagnosis not present

## 2024-12-14 DIAGNOSIS — M79674 Pain in right toe(s): Secondary | ICD-10-CM

## 2024-12-14 DIAGNOSIS — L84 Corns and callosities: Secondary | ICD-10-CM | POA: Diagnosis not present

## 2024-12-14 DIAGNOSIS — B351 Tinea unguium: Secondary | ICD-10-CM | POA: Diagnosis not present

## 2024-12-14 DIAGNOSIS — M79675 Pain in left toe(s): Secondary | ICD-10-CM | POA: Diagnosis not present

## 2024-12-14 DIAGNOSIS — E1142 Type 2 diabetes mellitus with diabetic polyneuropathy: Secondary | ICD-10-CM

## 2024-12-14 NOTE — Progress Notes (Signed)
 "  Subjective:  Patient ID: Crystal Dudley, female    DOB: Aug 29, 1971,  MRN: [id number]  Crystal Dudley presents to clinic today for at risk foot care. Patient has h/o NIDDM with amputation of partial amputation of right great toe and preulcerative lesion(s) of both feet and painful mycotic toenails that limit ambulation. Painful toenails interfere with ambulation. Aggravating factors include wearing enclosed shoe gear. Pain is relieved with periodic professional debridement. Painful preulcerative lesion(s) is/are aggravated when weightbearing with and without shoegear. Pain is relieved with periodic professional debridement.  Chief Complaint  Patient presents with   Diabetes    DFC IDDM A1C 6.2 Toenail trim. LOV with PCP 10/18/24.   New problem(s): None.   PCP is Loreli Kins, MD.  Allergies[1]  Review of Systems: Negative except as noted in the HPI.  Objective: No changes noted in today's physical examination. There were no vitals filed for this visit. Crystal Dudley is a pleasant 53 y.o. female in NAD. AAO x 3.  Vascular Examination: Capillary refill time immediate b/l. Palpable pedal pulses. Pedal hair absent b/l. No pain with calf compression b/l. Skin temperature gradient WNL b/l. No cyanosis or clubbing b/l. No ischemia or gangrene noted b/l. No edema noted b/l LE.  Neurological Examination: Sensation grossly intact b/l with 10 gram monofilament. Vibratory sensation intact b/l. Pt has subjective symptoms of neuropathy.  Dermatological Examination: Pedal skin with normal turgor, texture and tone b/l.  No open wounds. No interdigital macerations.   Toenails left great toe and 2-5 b/l thick, discolored, elongated with subungual debris and pain on dorsal palpation.   Preulcerative lesion noted  plantar middle phalanx right 2nd digit, submet head 1 left foot, submet head 1 right foot, and submet head 5 left foot. There is visible subdermal hemorrhage. There is no surrounding  erythema, no edema, no drainage, no odor, no fluctuance.  Musculoskeletal Examination: Muscle strength 5/5 to all lower extremity muscle groups bilaterally. Lower extremity amputation(s): partial amputation of R hallux. No pain, crepitus or joint limitation noted with ROM b/l LE.  Patient ambulates independently without assistive aids.  Radiographs: None  Assessment/Plan: 1. Pain due to onychomycosis of toenails of both feet   2. Pre-ulcerative calluses [L84]   3. 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 DM type 2 with diabetic peripheral neuropathy [facility])   Patient was evaluated and treated. All patient's and/or POA's questions/concerns addressed on today's visit. Mycotic toenails 1-5 b/l debrided in length and girth without incident. Preulcerative lesion(s) plantar stump right great toe, right second digit, submet head 1 b/l, and sub 5th met base left foot pared with sharp debridement without incident. Continue daily foot inspections and monitor blood glucose per PCP/Endocrinologist's recommendations. Continue soft, supportive shoe gear daily. Report any pedal injuries to medical professional. Call office if there are any questions/concerns. -Patient/POA to call should there be question/concern in the interim.   Return in about 9 weeks (around 02/15/2025).  Crystal Dudley, DPM      [place] LOCATION: [address] [place] [address]                   Office [phone]   [place]  LOCATION: [address] [place], [place] [address] Office [phone]     [1]  Allergies Allergen Reactions   Codeine Nausea And Vomiting   "

## 2025-02-15 ENCOUNTER — Ambulatory Visit: Admitting: Podiatry

## 2025-04-19 ENCOUNTER — Ambulatory Visit: Admitting: Podiatry
